# Patient Record
Sex: Male | Born: 1990 | Race: Black or African American | Hispanic: No | Marital: Married | Smoking: Current some day smoker
Health system: Southern US, Community
[De-identification: ages and names within clinical notes are randomized; demographics above are authoritative.]

## PROBLEM LIST (undated history)

## (undated) DIAGNOSIS — R569 Unspecified convulsions: Secondary | ICD-10-CM

## (undated) DIAGNOSIS — S0993XA Unspecified injury of face, initial encounter: Secondary | ICD-10-CM

## (undated) HISTORY — PX: NO PAST SURGERIES: SHX2092

---
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
## 2009-04-08 ENCOUNTER — Ambulatory Visit: Payer: Self-pay | Admitting: Infectious Diseases

## 2009-04-08 ENCOUNTER — Emergency Department (HOSPITAL_COMMUNITY): Admission: EM | Admit: 2009-04-08 | Discharge: 2009-04-08 | Payer: Self-pay | Admitting: Emergency Medicine

## 2009-04-08 ENCOUNTER — Observation Stay (HOSPITAL_COMMUNITY): Admission: EM | Admit: 2009-04-08 | Discharge: 2009-04-09 | Payer: Self-pay | Admitting: Emergency Medicine

## 2009-08-02 ENCOUNTER — Emergency Department (HOSPITAL_COMMUNITY): Admission: EM | Admit: 2009-08-02 | Discharge: 2009-08-02 | Payer: Self-pay | Admitting: Family Medicine

## 2009-09-04 ENCOUNTER — Ambulatory Visit: Payer: Self-pay | Admitting: Diagnostic Radiology

## 2009-09-04 ENCOUNTER — Emergency Department (HOSPITAL_BASED_OUTPATIENT_CLINIC_OR_DEPARTMENT_OTHER): Admission: EM | Admit: 2009-09-04 | Discharge: 2009-09-04 | Payer: Self-pay | Admitting: Emergency Medicine

## 2009-09-30 ENCOUNTER — Emergency Department (HOSPITAL_COMMUNITY): Admission: EM | Admit: 2009-09-30 | Discharge: 2009-09-30 | Payer: Self-pay | Admitting: Emergency Medicine

## 2009-10-03 ENCOUNTER — Emergency Department (HOSPITAL_COMMUNITY): Admission: EM | Admit: 2009-10-03 | Discharge: 2009-10-04 | Payer: Self-pay | Admitting: Emergency Medicine

## 2009-11-03 ENCOUNTER — Emergency Department (HOSPITAL_COMMUNITY): Admission: EM | Admit: 2009-11-03 | Discharge: 2009-11-03 | Payer: Self-pay | Admitting: Emergency Medicine

## 2010-05-05 ENCOUNTER — Emergency Department (HOSPITAL_COMMUNITY): Admission: EM | Admit: 2010-05-05 | Discharge: 2010-05-05 | Payer: Self-pay | Admitting: Emergency Medicine

## 2010-06-14 ENCOUNTER — Emergency Department (HOSPITAL_COMMUNITY)
Admission: EM | Admit: 2010-06-14 | Discharge: 2010-06-14 | Payer: Self-pay | Source: Home / Self Care | Admitting: Emergency Medicine

## 2010-11-03 ENCOUNTER — Emergency Department (HOSPITAL_COMMUNITY)
Admission: EM | Admit: 2010-11-03 | Discharge: 2010-11-03 | Payer: Self-pay | Source: Home / Self Care | Admitting: Emergency Medicine

## 2010-11-06 LAB — POCT I-STAT, CHEM 8
BUN: 10 mg/dL (ref 6–23)
Calcium, Ion: 1.13 mmol/L (ref 1.12–1.32)
Chloride: 102 mEq/L (ref 96–112)
Creatinine, Ser: 1.1 mg/dL (ref 0.4–1.5)
Glucose, Bld: 119 mg/dL — ABNORMAL HIGH (ref 70–99)
TCO2: 24 mmol/L (ref 0–100)

## 2010-11-27 ENCOUNTER — Emergency Department (HOSPITAL_COMMUNITY)
Admission: EM | Admit: 2010-11-27 | Discharge: 2010-11-27 | Disposition: A | Discharge status: Home / Self Care | Payer: Self-pay | Attending: Emergency Medicine | Admitting: Emergency Medicine

## 2010-11-27 ENCOUNTER — Emergency Department (HOSPITAL_COMMUNITY): Payer: Self-pay

## 2010-11-27 DIAGNOSIS — M25549 Pain in joints of unspecified hand: Secondary | ICD-10-CM | POA: Insufficient documentation

## 2010-11-27 DIAGNOSIS — M7989 Other specified soft tissue disorders: Secondary | ICD-10-CM | POA: Insufficient documentation

## 2010-11-27 DIAGNOSIS — G40909 Epilepsy, unspecified, not intractable, without status epilepticus: Secondary | ICD-10-CM | POA: Insufficient documentation

## 2010-11-27 DIAGNOSIS — M79609 Pain in unspecified limb: Secondary | ICD-10-CM | POA: Insufficient documentation

## 2010-12-30 LAB — BASIC METABOLIC PANEL
CO2: 16 mEq/L — ABNORMAL LOW (ref 19–32)
Calcium: 9.1 mg/dL (ref 8.4–10.5)
Chloride: 111 mEq/L (ref 96–112)
GFR calc Af Amer: >60 mL/min (ref >60)
Glucose, Bld: 118 mg/dL — ABNORMAL HIGH (ref 70–99)
Potassium: 5 mEq/L (ref 3.5–5.1)
Sodium: 142 mEq/L (ref 135–145)

## 2010-12-30 LAB — URINALYSIS, ROUTINE W REFLEX MICROSCOPIC
Glucose, UA: NEGATIVE mg/dL
Hgb urine dipstick: NEGATIVE
Ketones, ur: NEGATIVE mg/dL
Leukocytes, UA: NEGATIVE
pH: 6 (ref 5.0–8.0)

## 2010-12-30 LAB — CBC
HCT: 44.6 % (ref 39.0–52.0)
Hemoglobin: 14.7 g/dL (ref 13.0–17.0)
MCH: 27.2 pg (ref 26.0–34.0)
MCHC: 32.9 g/dL (ref 30.0–36.0)
MCV: 82.7 fL (ref 78.0–100.0)
RBC: 5.39 MIL/uL (ref 4.22–5.81)

## 2010-12-30 LAB — DIFFERENTIAL
Eosinophils Absolute: 0.2 10*3/uL (ref 0.0–0.7)
Lymphs Abs: 2.4 10*3/uL (ref 0.7–4.0)
Monocytes Absolute: 0.5 10*3/uL (ref 0.1–1.0)
Monocytes Relative: 9 % (ref 3–12)
Neutrophils Relative %: 40 % — ABNORMAL LOW (ref 43–77)

## 2010-12-30 LAB — URINE MICROSCOPIC-ADD ON

## 2010-12-30 LAB — RAPID URINE DRUG SCREEN, HOSP PERFORMED
Benzodiazepines: POSITIVE — AB
Cocaine: NOT DETECTED
Opiates: NOT DETECTED

## 2011-01-15 LAB — POCT I-STAT, CHEM 8
HCT: 44 % (ref 39.0–52.0)
Hemoglobin: 15 g/dL (ref 13.0–17.0)
Potassium: 3.9 mEq/L (ref 3.5–5.1)
Sodium: 137 mEq/L (ref 135–145)

## 2011-01-15 LAB — STREP A DNA PROBE: Group A Strep Probe: NEGATIVE

## 2011-01-17 LAB — CBC
Hemoglobin: 13.5 g/dL (ref 13.0–17.0)
RBC: 5.06 MIL/uL (ref 4.22–5.81)
WBC: 7.4 10*3/uL (ref 4.0–10.5)

## 2011-01-17 LAB — COMPREHENSIVE METABOLIC PANEL
ALT: 18 U/L (ref 0–53)
Alkaline Phosphatase: 84 U/L (ref 39–117)
CO2: 26 mEq/L (ref 19–32)
Chloride: 100 mEq/L (ref 96–112)
GFR calc non Af Amer: >60 mL/min (ref >60)
Glucose, Bld: 93 mg/dL (ref 70–99)
Potassium: 4.2 mEq/L (ref 3.5–5.1)
Sodium: 138 mEq/L (ref 135–145)
Total Protein: 8.6 g/dL — ABNORMAL HIGH (ref 6.0–8.3)

## 2011-01-17 LAB — DIFFERENTIAL
Basophils Relative: 1 % (ref 0–1)
Eosinophils Absolute: 0 10*3/uL (ref 0.0–0.7)
Monocytes Relative: 12 % (ref 3–12)
Neutrophils Relative %: 71 % (ref 43–77)

## 2011-01-17 LAB — MONONUCLEOSIS SCREEN: Mono Screen: NEGATIVE

## 2011-01-22 LAB — CBC
MCHC: 32.5 g/dL (ref 30.0–36.0)
MCV: 84 fL (ref 78.0–100.0)
Platelets: 221 10*3/uL (ref 150–400)
RBC: 4.72 MIL/uL (ref 4.22–5.81)
RBC: 5.49 MIL/uL (ref 4.22–5.81)
RDW: 16.4 % — ABNORMAL HIGH (ref 11.5–15.5)
WBC: 6.3 10*3/uL (ref 4.0–10.5)

## 2011-01-22 LAB — POCT I-STAT, CHEM 8
BUN: 15 mg/dL (ref 6–23)
Calcium, Ion: 1.27 mmol/L (ref 1.12–1.32)
Chloride: 107 mEq/L (ref 96–112)
Glucose, Bld: 110 mg/dL — ABNORMAL HIGH (ref 70–99)
HCT: 50 % (ref 39.0–52.0)

## 2011-01-22 LAB — RAPID URINE DRUG SCREEN, HOSP PERFORMED
Amphetamines: NOT DETECTED
Barbiturates: NOT DETECTED
Benzodiazepines: NOT DETECTED
Opiates: NOT DETECTED

## 2011-01-22 LAB — DIFFERENTIAL
Eosinophils Relative: 0 % (ref 0–5)
Lymphocytes Relative: 8 % — ABNORMAL LOW (ref 12–46)
Lymphs Abs: 0.5 10*3/uL — ABNORMAL LOW (ref 0.7–4.0)

## 2011-01-22 LAB — BASIC METABOLIC PANEL
Calcium: 8.8 mg/dL (ref 8.4–10.5)
Chloride: 107 mEq/L (ref 96–112)
Creatinine, Ser: 0.84 mg/dL (ref 0.4–1.5)
GFR calc Af Amer: >60 mL/min (ref >60)

## 2011-01-22 LAB — HIV ANTIBODY (ROUTINE TESTING W REFLEX): HIV: NONREACTIVE

## 2011-01-22 LAB — COMPREHENSIVE METABOLIC PANEL
AST: 40 U/L — ABNORMAL HIGH (ref 0–37)
CO2: 23 mEq/L (ref 19–32)
Calcium: 9.2 mg/dL (ref 8.4–10.5)
Creatinine, Ser: 0.85 mg/dL (ref 0.4–1.5)
GFR calc Af Amer: >60 mL/min (ref >60)
GFR calc non Af Amer: >60 mL/min (ref >60)
Total Protein: 7.8 g/dL (ref 6.0–8.3)

## 2011-01-22 LAB — TSH: TSH: 0.873 u[IU]/mL (ref 0.350–4.500)

## 2011-01-22 LAB — PHENYTOIN LEVEL, TOTAL: Phenytoin Lvl: <2.5 ug/mL — ABNORMAL LOW (ref 10.0–20.0)

## 2011-01-29 LAB — COMPREHENSIVE METABOLIC PANEL
Alkaline Phosphatase: 59 U/L (ref 39–117)
BUN: 10 mg/dL (ref 6–23)
Calcium: 9.7 mg/dL (ref 8.4–10.5)
Glucose, Bld: 114 mg/dL — ABNORMAL HIGH (ref 70–99)
Total Protein: 7.6 g/dL (ref 6.0–8.3)

## 2011-01-29 LAB — RAPID URINE DRUG SCREEN, HOSP PERFORMED
Amphetamines: NOT DETECTED
Barbiturates: NOT DETECTED
Benzodiazepines: NOT DETECTED
Opiates: NOT DETECTED

## 2011-02-27 NOTE — Discharge Summary (Signed)
NAMESEDRICK, TOBER NO.:  [id number]   MEDICAL RECORD NO.:  [id number]          PATIENT TYPE:  OBV   LOCATION:  3003                         FACILITY:  [facility]   PHYSICIAN:  Mick Sell, MD DATE OF BIRTH:  [birth year]   DATE OF ADMISSION:  04/08/2009  DATE OF DISCHARGE:  04/09/2009                               DISCHARGE SUMMARY   DISCHARGE DIAGNOSES:  1. Seizure - secondary to medical noncompliance with Dilantin.  2. Polysubstance abuse - alcohol occasionally and      tetrahydrocannabinol.  Urine drug screen pending on discharge.  3. History of seizure in January 2010 - two admissions through      emergency department for generalized seizure.  4. History of maxillofacial trauma in July 2009.   DISCHARGE MEDICATIONS:  Dilantin 100 mg tablet, takes 3 tablets at  nighttime.   DISPOSITION/FOLLOW UP:  Patient discharged from the hospital in stable  condition.  No postictal state.  Will follow up with Dr. Aldine Contes at [facility]  on July 12th, 2010 for further evaluation and management.  Follow up on  UDS results.  Pt will have to have EEG scheduled as an outpatient. Also  evaluate medical compliance with Dilantin. Please check Dilantin levels  on appointment.   CONSULTATIONS:  None.   PROCEDURES:  CT of the head without contrast - no acute intracranial  abnormalities, no hemorrhage, no mass lesions or acute infarctions.  Unremarkable bone and paranasal sinuses.   HISTORY OF PRESENT ILLNESS:  Pt is 20 year old male with nonsignificant  past medical history except 2 seizure episodes starting January 2010.  He presented to [facility] Emergency Department with main concern of  seizure episode.  It occurred on the morning of day of admission when  the patient was asleep.  Per ED report, the patient came into the  hospital, refused labs and as he was leaving he had another seizure;  reported not remembering any of the events before the seizure episode.  He was in his  usual state of health before the seizure.  He denied any  headaches.  No visual changes.  No auras.  No abdominal or urinary  concerns. Post seizure, he reported headaches occasionally,  nonconclusive and associated with double vision, vomiting, 5 episodes,  nonbloody, feeling tired.  Denied alcohol or drug use.   PHYSICAL EXAMINATION:  VITAL SIGNS:  T 97.1, BP 112/70, P 62, R 80, Sat  100% on RA.   GENERAL:  On physical exam, not in acute distress.  HEENT:  PERRLA, EOMI.  No conjunctival pallor.  No icterus.  Moist  mucous membranes.  No oropharyngeal erythema.  Bite marks on the side of  the tongue.  NECK:  Supple, no stiffness  LUNGS:  Good air movement.  No wheezing, crackles or rhonchi.  CARDIOVASCULAR:   Regular rate and rhythm. S1 and S2.  No JVD.  No  murmur.  ABDOMEN:  Soft, nontender, nondistended.  Bowel sounds present.  EXTREMITIES:  No edema.  No cyanosis.  SKIN:  No rashes or petechiae.  MUSCULOSKELETAL:   No joint effusions or tenderness.  Full range  of  motion in all four extremities.  NEUROLOGIC EXAM:   Alert, oriented x3.  CN II-XII intact.  Motor 5/5  bilaterally upper and lower extremities.  No asterixis.  Normal heel-to-  shin and finger-to-nose.   LABS:  Na 140, K 3.7, Cl 107, HCO3 19, BUN 15, Cr 1, glucose 110.  Bilirubin 0.5, alk phos 59, AST 40, ALT 18, protein 7.8, albumin 4.4,  calcium 9.2.  WBC 5.9, hemoglobin 15, platelets 221.   HOSPITAL COURSE:  1. Seizures:  This is the third admission for this patient secondary      to medical noncompliance with Dilantin.  On admission, Dilantin      level was 2.5.  Dilantin load was given.  The patient had one      seizure episode during the hospitalization, but has remained stable      and neurologically overall nonfocal exam.  Alcohol level is      undetectable.  HIV nonreactive.  TSH within normal limits.      Electrolytes within normal limits.  No hyperglycemia, no      hypernatremia, no hypocalcemia.  UDS  is pending on discharge.  The      patient will follow up with Dr. Colon Branch in [facility] for further      evaluation and management.  EEG to be done in outpatient setting.   Over 30 minutes was spent on discharge with the patient.      Mliss Sax, MD      Mick Sell, MD  Electronically Signed    IM/MEDQ  D:  04/09/2009  T:  04/09/2009  Job:  [id number]   cc:   Mick Sell, MD   Dr. Linton Rump at [facility]

## 2011-04-05 ENCOUNTER — Inpatient Hospital Stay (HOSPITAL_COMMUNITY)
Admission: EM | Admit: 2011-04-05 | Discharge: 2011-04-06 | DRG: 101 | Disposition: A | Discharge status: Home / Self Care | Payer: Self-pay | Attending: Internal Medicine | Admitting: Internal Medicine

## 2011-04-05 DIAGNOSIS — G40909 Epilepsy, unspecified, not intractable, without status epilepticus: Principal | ICD-10-CM | POA: Diagnosis present

## 2011-04-05 DIAGNOSIS — F1210 Cannabis abuse, uncomplicated: Secondary | ICD-10-CM | POA: Diagnosis present

## 2011-04-05 DIAGNOSIS — Z9119 Patient's noncompliance with other medical treatment and regimen: Secondary | ICD-10-CM

## 2011-04-05 DIAGNOSIS — F17200 Nicotine dependence, unspecified, uncomplicated: Secondary | ICD-10-CM | POA: Diagnosis present

## 2011-04-05 DIAGNOSIS — Z91199 Patient's noncompliance with other medical treatment and regimen due to unspecified reason: Secondary | ICD-10-CM

## 2011-04-05 LAB — DIFFERENTIAL
Basophils Absolute: 0 10*3/uL (ref 0.0–0.1)
Basophils Relative: 0 % (ref 0–1)
Eosinophils Absolute: 0 10*3/uL (ref 0.0–0.7)
Eosinophils Relative: 0 % (ref 0–5)
Lymphocytes Relative: 10 % — ABNORMAL LOW (ref 12–46)

## 2011-04-05 LAB — PHENYTOIN LEVEL, TOTAL: Phenytoin Lvl: <10 ug/mL — ABNORMAL LOW (ref 10.0–20.0)

## 2011-04-05 LAB — RAPID URINE DRUG SCREEN, HOSP PERFORMED
Amphetamines: NOT DETECTED
Barbiturates: NOT DETECTED
Benzodiazepines: NOT DETECTED
Tetrahydrocannabinol: POSITIVE — AB

## 2011-04-05 LAB — CBC
MCHC: 33 g/dL (ref 30.0–36.0)
Platelets: 277 10*3/uL (ref 150–400)
RDW: 13 % (ref 11.5–15.5)
WBC: 9.3 10*3/uL (ref 4.0–10.5)

## 2011-04-05 LAB — MAGNESIUM: Magnesium: 2 mg/dL (ref 1.5–2.5)

## 2011-04-05 LAB — POCT I-STAT, CHEM 8
Calcium, Ion: 1.18 mmol/L (ref 1.12–1.32)
Chloride: 107 mEq/L (ref 96–112)
Glucose, Bld: 100 mg/dL — ABNORMAL HIGH (ref 70–99)
HCT: 44 % (ref 39.0–52.0)

## 2011-04-05 LAB — URINALYSIS, ROUTINE W REFLEX MICROSCOPIC
Bilirubin Urine: NEGATIVE
Hgb urine dipstick: NEGATIVE
Ketones, ur: NEGATIVE mg/dL
Nitrite: NEGATIVE
pH: 5.5 (ref 5.0–8.0)

## 2011-04-05 LAB — CK TOTAL AND CKMB (NOT AT ARMC)
CK, MB: 4.3 ng/mL — ABNORMAL HIGH (ref 0.3–4.0)
Total CK: 726 U/L — ABNORMAL HIGH (ref 7–232)

## 2011-04-10 NOTE — H&P (Signed)
Kyle Adams, Kyle Adams NO.:  [id number]  MEDICAL RECORD NO.:  [id number]  LOCATION:  WLED                         FACILITY:  [facility]  PHYSICIAN:  Conley Canal, MD      DATE OF BIRTH:  1991-03-22  DATE OF ADMISSION:  04/05/2011 DATE OF DISCHARGE:                             HISTORY & PHYSICAL   The patient has no primary care provider.  CHIEF COMPLAINT:  Seizures.  HISTORY OF PRESENT ILLNESS:  Kyle Adams is a 20 year old male with history of epilepsy who has been on Dilantin for the last 5 years or so. The patient comes in because he had a seizure witnessed by his significant other around 1 a.m.  According to the significant other, this lasted about 3 minutes.  It was associated with incontinence and was generalized.  She has experienced this for the last 5 years that she has been with the patient.  She says that he ran out of his Dilantin and had been trying to stretch it to take it for as long as possible as he ran out of refills and he does not have a primary care provider to get prescriptions.  Since 1 a.m. the patient has had 3 seizures.  When he presented to the emergency room, his Dilantin level was less than 10 and urine drug screen was positive for marijuana.  Urinalysis was normal. He has received 2 mg of Ativan as well as 1 g of Dilantin IV.  At the time of my evaluation, the patient is sedated most likely because of the Ativan, but he is arousable.  He is not able to give me any history, so all the history was obtained from previous medical records as well as his significant other who is very involved in his care.  According to her, the patient has had no fevers, no headaches.  PAST MEDICAL HISTORY: 1. Seizure disorder. 2. History of head trauma in 2009. 3. History of marijuana use. 4. Tobacco abuse.  SOCIAL HISTORY:  The patient lives with his significant other.  He has 2 kids.  Smokes cigarettes, marijuana.  No alcohol.  ALLERGIES:  No  known drug allergies.  HOME MEDICATIONS:  Dilantin 300 mg q.h.s.  FAMILY HISTORY:  His significant other denies family history of epilepsy.  No other history could be obtained.  REVIEW OF SYSTEMS:  Unremarkable except as highlighted in the history of present illness.  PHYSICAL EXAMINATION:  GENERAL:  This is a young man who is somnolent. He is not in acute distress. VITAL SIGNS:  Blood pressure 130 systolic, heart rate 70s, he is afebrile, oxygenating adequately, respiratory rate around 16. HEAD, EARS, NOSE AND THROAT:  Pupils equal, reacting to light.  No jugular venous distention. NECK:  No carotid bruits.  No neck stiffness. RESPIRATORY SYSTEM:  Good air entry bilaterally with no rhonchi, rales, or wheezes. CARDIOVASCULAR SYSTEM:  First, second heart sounds heard.  No murmurs. Pulse regular. ABDOMEN:  Scaphoid, soft, nontender.  No palpable organomegaly.  Bowel sounds are normal. CNS:  Patient is mostly somnolent, but follows commands.  He is moving all extremities.  No focalizing deficits. EXTREMITIES:  No pedal edema.  Peripheral pulses equal.  LABORATORY  DATA:  Reviewed.  Significant for Dilantin level less than 10.  Urine drug screen positive for tetrahydrocannabinol.  Urinalysis negative.  Electrolytes unremarkable.  No imaging studies.  IMPRESSION:  A 20 year old male with known history of epilepsy who comes in with breakthrough seizures most likely due to nonadherence with medications as his Dilantin level is subtherapeutic.  There is no evidence of localizing deficits on exam to worry about an acute intracranial event.  He has been loaded with Dilantin.  PLAN: 1. The patient will be admitted to telemetry with seizure precautions.     We will continue Dilantin and give Ativan as needed.  If any     significant neurological problems, we will obtain CT of the brain     with contrast to further evaluate, otherwise we will just observe     for the patient to be fully  awake.  He will require clinical social     work assistance to ensure proper followup and medication     assistance. 2. DVT, GI prophylaxis. 3. The patient's condition closely guarded.     Conley Canal, MD     SR/MEDQ  D:  04/05/2011  T:  04/05/2011  Job:  [id number]  Electronically Signed by Conley Canal  on 04/10/2011 10:21:21 AM

## 2011-04-10 NOTE — Discharge Summary (Signed)
NAMEPELHAM, HENNICK NO.:  [id number]  MEDICAL RECORD NO.:  [id number]  LOCATION:  1405                         FACILITY:  [facility]  PHYSICIAN:  Conley Canal, MD      DATE OF BIRTH:  [birth year]  DATE OF ADMISSION:  04/05/2011 DATE OF DISCHARGE:  04/06/2011                        DISCHARGE SUMMARY - REFERRING   PRIMARY CARE PROVIDER:  [facility].  DISCHARGE DIAGNOSES: 1. Breakthrough seizure secondary to noncompliance with Dilantin. 2. History of polysubstance abuse.  DISCHARGE MEDICATIONS:  Dilantin 300 mg p.o. daily at bedtime.  DIAGNOSTIC LABS:  Sodium 142, potassium 3.9, chloride 107, CO2 23, BUN 12, creatinine 1.00, WBC 9.3, hemoglobin 12.9, hematocrit 39.1, platelets 277,000, neutrophils 87%, absolute neutrophils 8.0, magnesium 2.0.  Total CK 726, CK-MB 4.3, troponin I less than 0.30.  Dilantin level 5.9.  DIAGNOSTIC IMAGING:  None.  CONSULTATIONS:  None.  PROCEDURES:  None.  BRIEF HISTORY:  Mr. Oien is a 20 year old male with a history of epilepsy.  He has been on Dilantin for the last 5 years or so.  The patient came to the [facility] Emergency Room because he had a seizure witnessed by his significant other around 1 a.m. on June 20.  According to the significant other, it lasted about 3 minutes.  It was associated with incontinence and was generalized.  She indicated that the patient had run out of Dilantin and had been trying to stretch it to take it for as long as possible as he ran out of refills and does not have a primary care provider to get prescriptions.  The patient had three seizures after that.  When he presented to the emergency room, his Dilantin level was less than 10 and a urine drug screen was positive for marijuana. The patient was admitted under close observation and was given a loading dose of Dilantin.  [facility] were called to admit.  HOSPITAL COURSE BY PROBLEM: 1. Breakthrough seizure secondary to  nonadherence to Dilantin.  The     patient received 2 mg of Ativan in the emergency room as well as 1     g of Dilantin IV.  He was admitted to telemetry floor on seizure     precautions.  He was monitored closely.  Through the night, there     was no further seizure activity.  Dilantin level this morning is     13.3. 2. History of polysubstance abuse.  Urine drug screen on admission was     positive for marijuana.  The patient was educated and/or counseled     regarding polysubstance abuse and the risks given his seizure     disorder.  PHYSICAL EXAMINATION:  VITAL SIGNS:  Temperature 97.8, blood pressure 104/68, heart rate 96, respirations 18, sats 96% on room air. GENERAL:  Lying in bed with his girlfriend, no acute distress. CV:  Regular rate and rhythm.  No murmur, gallop or rub.  No lower extremity edema. RESPIRATORY:  Nonlabored breath sounds.  Clear to auscultation bilaterally.  No rhonchi, wheezes or rales. ABDOMEN:  Flat, soft, positive bowel sounds throughout, nontender to palpation. NEUROLOGIC:  Alert and oriented x3.  Speech clear.  Gait steady.  FOLLOWUP:  The patient  will follow up with [facility] in 1 to 2 weeks. The patient will be provided with prescription for his Dilantin as well as instructions regarding compliance.  DISPOSITION:  The patient is medically stable and ready to be discharged to home.  Time spent on this discharge 35 minutes.     Gwenyth Bender, NP   ______________________________ Conley Canal, MD    KMB/MEDQ  D:  04/06/2011  T:  04/06/2011  Job:  [id number]  Electronically Signed by Conley Canal  on 04/10/2011 10:21:23 AM

## 2011-06-07 ENCOUNTER — Observation Stay (HOSPITAL_COMMUNITY)
Admission: EM | Admit: 2011-06-07 | Discharge: 2011-06-07 | Discharge status: Against Medical Advice | Payer: Self-pay | Attending: Internal Medicine | Admitting: Internal Medicine

## 2011-06-07 DIAGNOSIS — Z91199 Patient's noncompliance with other medical treatment and regimen due to unspecified reason: Secondary | ICD-10-CM | POA: Insufficient documentation

## 2011-06-07 DIAGNOSIS — R1110 Vomiting, unspecified: Secondary | ICD-10-CM | POA: Insufficient documentation

## 2011-06-07 DIAGNOSIS — Z79899 Other long term (current) drug therapy: Secondary | ICD-10-CM | POA: Insufficient documentation

## 2011-06-07 DIAGNOSIS — F17200 Nicotine dependence, unspecified, uncomplicated: Secondary | ICD-10-CM | POA: Insufficient documentation

## 2011-06-07 DIAGNOSIS — Z9119 Patient's noncompliance with other medical treatment and regimen: Secondary | ICD-10-CM | POA: Insufficient documentation

## 2011-06-07 DIAGNOSIS — G40309 Generalized idiopathic epilepsy and epileptic syndromes, not intractable, without status epilepticus: Principal | ICD-10-CM | POA: Insufficient documentation

## 2011-06-07 LAB — BASIC METABOLIC PANEL
BUN: 15 mg/dL (ref 6–23)
Chloride: 103 mEq/L (ref 96–112)
GFR calc Af Amer: >60 mL/min (ref >60)
Glucose, Bld: 96 mg/dL (ref 70–99)
Potassium: 3.6 mEq/L (ref 3.5–5.1)

## 2011-07-19 ENCOUNTER — Emergency Department (HOSPITAL_COMMUNITY)
Admission: EM | Admit: 2011-07-19 | Discharge: 2011-07-19 | Disposition: A | Discharge status: Home / Self Care | Payer: Medicaid Other | Attending: Emergency Medicine | Admitting: Emergency Medicine

## 2011-07-19 DIAGNOSIS — Z79899 Other long term (current) drug therapy: Secondary | ICD-10-CM | POA: Insufficient documentation

## 2011-07-19 DIAGNOSIS — G40909 Epilepsy, unspecified, not intractable, without status epilepticus: Secondary | ICD-10-CM | POA: Insufficient documentation

## 2011-07-19 LAB — BASIC METABOLIC PANEL
GFR calc non Af Amer: >90 mL/min (ref >90)
Glucose, Bld: 109 mg/dL — ABNORMAL HIGH (ref 70–99)
Potassium: 4.1 mEq/L (ref 3.5–5.1)
Sodium: 137 mEq/L (ref 135–145)

## 2011-07-19 LAB — BASIC METABOLIC PANEL WITH GFR
BUN: 13 mg/dL (ref 6–23)
CO2: 25 meq/L (ref 19–32)
Calcium: 9.3 mg/dL (ref 8.4–10.5)
Chloride: 104 meq/L (ref 96–112)
Creatinine, Ser: 0.82 mg/dL (ref 0.50–1.35)
GFR calc Af Amer: >90 mL/min (ref >90)

## 2011-07-19 LAB — PHENYTOIN LEVEL, TOTAL: Phenytoin Lvl: <2.5 ug/mL — ABNORMAL LOW (ref 10.0–20.0)

## 2011-07-25 NOTE — Discharge Summary (Signed)
  NAMELANE, ELAND NO.:  [id number]  MEDICAL RECORD NO.:  [id number]  LOCATION:  1441                         FACILITY:  [facility]  PHYSICIAN:  Baltazar Najjar, MD     DATE OF BIRTH:  1991/01/04  DATE OF ADMISSION:  06/07/2011 DATE OF DISCHARGE:  06/07/2011                              DISCHARGE SUMMARY   ADDENDUM: This is a note to document the patient leaving the hospital against medical advice.  Please refer to H and P that dictated by the undersigned physician today for more details.  The patient was postictal when I admitted him this morning.  He was very lethargic, so I kept him n.p.o., and requested a bedside swallow evaluation.  Apparently, that was not done until the patient went to the floor later on the day and the patient was very frustrated that he would like to be fed immediately.  I reviewed the course from RN regarding this issue and I requested the RN bedside swallow evaluation and then initiation of diet if he passes, however, I was informed by the nurse, it cannot be done as per protocol because it can be done only for patients admitted for TIA or stroke .  I was coming to reevaluate the  patient and start him on a diet  after I finish seeing a patient in the ED , however, the patient left the hospital  against medical advice prior to my arrival.  As per nurse, his girlfriend who was in the room pulled his IV and they left the hospital against medical advise.          ______________________________ Baltazar Najjar, MD     SA/MEDQ  D:  06/07/2011  T:  06/07/2011  Job:  [id number]  cc:   Clinic [facility] Fax: [phone]  Electronically Signed by Hannah Beat MD on 07/25/2011 07:11:24 PM

## 2011-07-25 NOTE — H&P (Signed)
NAMELANDRUM, CARBONELL NO.:  [id number]  MEDICAL RECORD NO.:  [id number]  LOCATION:  WLED                         FACILITY:  [facility]  PHYSICIAN:  Baltazar Najjar, MD     DATE OF BIRTH:  12-07-1990  DATE OF ADMISSION:  06/07/2011 DATE OF DISCHARGE:                             HISTORY & PHYSICAL   PCP:  [facility].  CHIEF COMPLAINT:  Seizures.  HISTORY OF PRESENT ILLNESS:  Mr. Kyle Adams is a 20 year old African American man with history of seizure disorders and who is apparently noncompliance with his Dilantin.  He was discharged last from the hospital in June 2012 and was instructed to take Dilantin 300 mg q.h.s.; however, the patient was taking it every other day.  The patient is currently postictal.  He is lethargic and history is provided by his girlfriend at bedside and from the emergency room records.  Girlfriend stated that the patient had 2 episodes of seizures at home, each lasted about 3-4 minutes.  She described it as tonic-clonic generalized seizures.  Denied any association of bowel or urinary incontinence, no tongue biting.  In the ED, the patient had another episode of seizure, which as per ER records lasted about a minute and associated with tongue biting this time with no incontinence.  The patient's Dilantin level was found to be subtherapeutic less than 2.5.  He was loaded with 1200 mg of Dilantin and we were called and asked to admit the patient for observation and further management.  PAST MEDICAL HISTORY: 1. History of seizure disorder. 2. Prior history of polysubstance abuse. 3. Tobacco abuse. 4. History of head trauma in 2009. 5. Medical noncompliance.  ALLERGIES:  No known drug allergies.  HOME MEDICATION:  He was prescribed Dilantin 300 mg q.h.s., but was taking it every other day.  SOCIAL HISTORY:  Lives with his girlfriend, smoke cigarettes.  As per girlfriend, no illicit drugs or EtOH and she stated that he is  currently unemployed and however studying to take his GED.  REVIEW OF SYSTEMS:  Unobtainable.  The patient is postictal.  However as per girlfriend, there are no other symptoms reported to her by him.  He was eating and drinking well.  There was no fever or chills.  No cough or shortness of breath.  No abdominal pain or change in bowel habits.  PHYSICAL EXAMINATION:  VITAL SIGNS:  Blood pressure 110/66, heart rate of 85, temperature 98.5, O2 sat 96% on room air. GENERAL:  He is lethargic, however, arousable, not in acute distress. NECK:  Supple. LUNGS:  Clear to auscultation bilaterally.  No rales or wheezing appreciated.  His respiratory rate is about 18. CARDIOVASCULAR SYSTEM:  S1 and S2, regular rhythm and rate.  No murmurs. ABDOMEN:  Soft, nontender.  Bowel sounds normal. EXTREMITIES:  No pedal edema. NEUROLOGICAL:  The patient is lethargic, however, arousable and he moves all extremities.  There is no focal deficit appreciated on limited exam.  LAB RESULTS:  Dilantin level less than 2.5, potassium 3.6, BUN 15, creatinine 1.02.  ASSESSMENT/PLAN: 1. Seizure secondary to subtherapeutic Dilantin level. 2. Subtherapeutic Dilantin level secondary to noncompliance with meds. 3. Tobacco abuse.  PLAN: 1. The patient will  be observed on telemetry.  We will apply seizures     precautions.  The patient was already loaded with Dilantin in the     ED. 2. We will request bedside swallow evaluation prior to initiating diet     and p.o. meds. 3. Ativan p.r.n. for any breakthrough seizures. 4. The patient will need counseling on compliance with his medication,     he was supposed to follow with [facility], but apparently he is     not following with them and he gets his medications through ED     visits. 5. Social worker consult for counseling on medication adherence and on     tobacco cessation as well and for referral to [facility].          ______________________________ Baltazar Najjar, MD     SA/MEDQ  D:  06/07/2011  T:  06/07/2011  Job:  [id number]  Electronically Signed by Hannah Beat MD on 07/25/2011 07:04:20 PM

## 2011-08-26 ENCOUNTER — Encounter: Payer: Self-pay | Admitting: Emergency Medicine

## 2011-08-26 ENCOUNTER — Emergency Department (HOSPITAL_COMMUNITY)
Admission: EM | Admit: 2011-08-26 | Discharge: 2011-08-26 | Disposition: A | Discharge status: Home / Self Care | Payer: Self-pay | Attending: Emergency Medicine | Admitting: Emergency Medicine

## 2011-08-26 DIAGNOSIS — R61 Generalized hyperhidrosis: Secondary | ICD-10-CM | POA: Insufficient documentation

## 2011-08-26 DIAGNOSIS — Z79899 Other long term (current) drug therapy: Secondary | ICD-10-CM | POA: Insufficient documentation

## 2011-08-26 DIAGNOSIS — R000 Tachycardia, unspecified: Secondary | ICD-10-CM | POA: Insufficient documentation

## 2011-08-26 DIAGNOSIS — R404 Transient alteration of awareness: Secondary | ICD-10-CM | POA: Insufficient documentation

## 2011-08-26 DIAGNOSIS — F17200 Nicotine dependence, unspecified, uncomplicated: Secondary | ICD-10-CM | POA: Insufficient documentation

## 2011-08-26 DIAGNOSIS — R569 Unspecified convulsions: Secondary | ICD-10-CM | POA: Insufficient documentation

## 2011-08-26 DIAGNOSIS — R4182 Altered mental status, unspecified: Secondary | ICD-10-CM | POA: Insufficient documentation

## 2011-08-26 HISTORY — DX: Unspecified convulsions: R56.9

## 2011-08-26 LAB — BASIC METABOLIC PANEL
BUN: 14 mg/dL (ref 6–23)
Chloride: 103 mEq/L (ref 96–112)
GFR calc Af Amer: >90 mL/min (ref >90)
Potassium: 3.9 mEq/L (ref 3.5–5.1)
Sodium: 138 mEq/L (ref 135–145)

## 2011-08-26 MED ORDER — SODIUM CHLORIDE 0.9 % IV SOLN
1000.0000 mg | Freq: Once | INTRAVENOUS | Status: AC
  Administered 2011-08-26: 1000 mg via INTRAVENOUS
  Filled 2011-08-26 (×2): qty 20

## 2011-08-26 MED ORDER — LORAZEPAM 2 MG/ML IJ SOLN
INTRAMUSCULAR | Status: AC
  Administered 2011-08-26: 2 mg via INTRAMUSCULAR
  Filled 2011-08-26: qty 1

## 2011-08-26 MED ORDER — LORAZEPAM 2 MG/ML IJ SOLN: 2.0000 mg | Freq: Once | INTRAMUSCULAR | Status: DC

## 2011-08-26 MED ORDER — LORAZEPAM 2 MG/ML IJ SOLN
INTRAMUSCULAR | Status: AC
  Filled 2011-08-26: qty 1

## 2011-08-26 MED ORDER — LORAZEPAM 2 MG/ML IJ SOLN
2.0000 mg | Freq: Once | INTRAMUSCULAR | Status: AC
  Administered 2011-08-26: 2 mg via INTRAMUSCULAR

## 2011-08-26 NOTE — ED Notes (Signed)
Pt had a seizure lasted near 45 seconds. IM ativan given as ordered, Md at bedside.

## 2011-08-26 NOTE — ED Notes (Signed)
Pt asked for blanket, provided, no other c/o.  Pulled off cardiac monitor and oxygen. States cannot rest with "this stuff on".

## 2011-08-26 NOTE — ED Provider Notes (Signed)
History     CSN: [id number] Arrival date & time: 08/26/2011  6:56 AM   First MD Initiated Contact with Patient 08/26/11 0703      Chief Complaint  Patient presents with  . Seizures    family reported to EMS that patient had seizure. EMS reported that patient was postictal upon arrival. patient was uncooperative with EMS crew    (Consider location/radiation/quality/duration/timing/severity/associated sxs/prior treatment) HPI This 20 year old male with a history of seizures now presents via EMS after a witnessed seizure. Per report the patient was found seizing by family, who called EMS. On arrival the patient was noted to be interactive, though inconsistently cooperative. On ED arrival, the patient denies complaints, though he is still somnolent. Past Medical History  Diagnosis Date  . Seizures     History reviewed. No pertinent past surgical history.  History reviewed. No pertinent family history.  History  Substance Use Topics  . Smoking status: Current Everyday Smoker  . Smokeless tobacco: Not on file  . Alcohol Use: Yes     occasionally      Review of Systems  Unable to perform ROS: Mental status change    Allergies  Review of patient's allergies indicates no known allergies.  Home Medications   Current Outpatient Rx  Name Route Sig Dispense Refill  . PHENYTOIN SODIUM EXTENDED 300 MG PO CAPS Oral Take 300 mg by mouth daily.        BP 115/59  Pulse 97  Temp(Src) 98 F (36.7 C) (Oral)  Resp 20  SpO2 92%  Physical Exam  Constitutional: He appears well-developed and well-nourished. He appears distressed.  HENT:  Head: Normocephalic and atraumatic.  Eyes: Conjunctivae are normal.  Neck: No JVD present. No thyromegaly present.  Cardiovascular: Tachycardia present.   Pulmonary/Chest: Effort normal and breath sounds normal. No stridor.  Abdominal: Soft. He exhibits no distension.  Musculoskeletal: He exhibits no edema.  Lymphadenopathy:    He has no  cervical adenopathy.  Neurological:       This is postictal on initial presentation. He wakens, gives minimally coherent responses. No focal neuro deficit, though the exam is incomplete.  Skin: Skin is warm. He is diaphoretic. No erythema.  Psychiatric:       Unable to assess    ED Course  Procedures (including critical care time)   Labs Reviewed  PHENYTOIN LEVEL, TOTAL  BASIC METABOLIC PANEL  MAGNESIUM   No results found.   No diagnosis found.    MDM  This 20 year old male presents following a seizure. Notably the patient has a history of seizures, and multiple visits with similar presentation, the most recent of which have all demonstrated subtherapeutic Dilantin levels. Soon after the patient's initial arrival he had a seizure. He received from muscular Ativan, and calm. Initial labs demonstrated subtherapeutic Dilantin, no other notable abnormalities. During the patient's episodes of near consciousness, he was not agreeable, fighting attempts for IV access. Given the patient's history of Dilantin noncompliance, and his frequent presentations for seizure, there is some concern for the patient's capacity to take care of himself. The patient's sister will be advised of these concerns on her arrival.  12:55 PM The patient had an additional seizure episode. Following provision of IV Ativan. The patient received an IV loading dose of Dilantin. Although the patient has a history of. Poor compliance with medications, he'll be discharged with instructions to take his Dilantin as directed.          Gerhard Munch, MD 08/26/11 1256

## 2011-08-26 NOTE — ED Notes (Signed)
Pt post ictal, resting quietly, iv placed with Md assist.

## 2011-08-26 NOTE — ED Notes (Signed)
Patient had seizure at home, ambulance found him postictal. Patient was uncooperative with the crew. Patient is drowsy at this time. Respond to questions appropriately. Stated he does not remember having seizure. He stated he has been congested.

## 2011-08-26 NOTE — ED Notes (Signed)
Pt post ictal, o2 via nrb placed and pt keeping on at present.

## 2011-08-26 NOTE — ED Notes (Signed)
PADS PLACED AROUND BED BY EMT R Natsumi Whitsitt

## 2011-08-26 NOTE — ED Notes (Signed)
Family was in waiting room instructed to come back to pod A, have not arrived. Pt states does not know phone number to call.

## 2011-08-26 NOTE — ED Notes (Signed)
Pt awake, was incontinent of urine, given disposable scrubs and assisted, shorts and underwear in belongings bag with pt. Pt aware family called and to come get him. Denies c/o at present.

## 2011-08-26 NOTE — ED Notes (Signed)
Family called and aware he is waiting for a ride home.

## 2011-08-26 NOTE — ED Notes (Signed)
Pt had seizure, Md aware, given ativan as ordered.

## 2011-08-26 NOTE — ED Notes (Signed)
Pt denies c/o. Sleeping off and on. No distress. vss.

## 2011-08-26 NOTE — ED Notes (Signed)
Pt resting quietly at present but is arousable. No distress. Aware had another seizure.

## 2011-08-26 NOTE — ED Notes (Signed)
Assumed care, no distress, Md aware pt refused iv, pt also refused lab work and Md aware. Pt states does not like needles and does not need labwork. Informed him that is will let us know if his dilantin level is low but state "I know" but still refuses. Pt also refuses oxygen.

## 2011-10-12 ENCOUNTER — Emergency Department (HOSPITAL_COMMUNITY): Payer: Medicaid Other

## 2011-10-12 ENCOUNTER — Inpatient Hospital Stay (HOSPITAL_COMMUNITY)
Admission: EM | Admit: 2011-10-12 | Discharge: 2011-10-13 | DRG: 101 | Disposition: A | Discharge status: Home / Self Care | Payer: Medicaid Other | Attending: Internal Medicine | Admitting: Internal Medicine

## 2011-10-12 ENCOUNTER — Encounter (HOSPITAL_COMMUNITY): Payer: Self-pay | Admitting: Emergency Medicine

## 2011-10-12 DIAGNOSIS — G40901 Epilepsy, unspecified, not intractable, with status epilepticus: Secondary | ICD-10-CM

## 2011-10-12 DIAGNOSIS — Z91199 Patient's noncompliance with other medical treatment and regimen due to unspecified reason: Secondary | ICD-10-CM

## 2011-10-12 DIAGNOSIS — R569 Unspecified convulsions: Secondary | ICD-10-CM | POA: Diagnosis present

## 2011-10-12 DIAGNOSIS — G40909 Epilepsy, unspecified, not intractable, without status epilepticus: Principal | ICD-10-CM | POA: Diagnosis present

## 2011-10-12 DIAGNOSIS — Z9119 Patient's noncompliance with other medical treatment and regimen: Secondary | ICD-10-CM

## 2011-10-12 DIAGNOSIS — D72829 Elevated white blood cell count, unspecified: Secondary | ICD-10-CM | POA: Diagnosis present

## 2011-10-12 DIAGNOSIS — F17200 Nicotine dependence, unspecified, uncomplicated: Secondary | ICD-10-CM | POA: Diagnosis present

## 2011-10-12 HISTORY — DX: Unspecified injury of face, initial encounter: S09.93XA

## 2011-10-12 LAB — BASIC METABOLIC PANEL
CO2: 18 mEq/L — ABNORMAL LOW (ref 19–32)
Calcium: 9.8 mg/dL (ref 8.4–10.5)
Creatinine, Ser: 1.09 mg/dL (ref 0.50–1.35)
Glucose, Bld: 137 mg/dL — ABNORMAL HIGH (ref 70–99)

## 2011-10-12 LAB — DIFFERENTIAL
Basophils Absolute: 0 10*3/uL (ref 0.0–0.1)
Eosinophils Relative: 0 % (ref 0–5)
Lymphocytes Relative: 13 % (ref 12–46)
Neutro Abs: 14.2 10*3/uL — ABNORMAL HIGH (ref 1.7–7.7)
Neutrophils Relative %: 81 % — ABNORMAL HIGH (ref 43–77)

## 2011-10-12 LAB — CBC
Platelets: 263 10*3/uL (ref 150–400)
RDW: 14.4 % (ref 11.5–15.5)
WBC: 17.6 10*3/uL — ABNORMAL HIGH (ref 4.0–10.5)

## 2011-10-12 LAB — RAPID URINE DRUG SCREEN, HOSP PERFORMED
Amphetamines: NOT DETECTED
Barbiturates: NOT DETECTED
Tetrahydrocannabinol: POSITIVE — AB

## 2011-10-12 LAB — PHENYTOIN LEVEL, TOTAL
Phenytoin Lvl: <2.5 ug/mL — ABNORMAL LOW (ref 10.0–20.0)
Phenytoin Lvl: 14 ug/mL (ref 10.0–20.0)

## 2011-10-12 MED ORDER — DOCUSATE SODIUM 100 MG PO CAPS
100.0000 mg | ORAL_CAPSULE | Freq: Two times a day (BID) | ORAL | Status: DC
  Filled 2011-10-12 (×5): qty 1

## 2011-10-12 MED ORDER — LORAZEPAM 2 MG/ML IJ SOLN
INTRAMUSCULAR | Status: AC
  Administered 2011-10-12: 2 mg via INTRAVENOUS
  Filled 2011-10-12: qty 1

## 2011-10-12 MED ORDER — SODIUM CHLORIDE 0.9 % IV SOLN: 350.0000 mg | INTRAVENOUS | Status: DC

## 2011-10-12 MED ORDER — SODIUM CHLORIDE 0.9 % IV SOLN
INTRAVENOUS | Status: AC
  Administered 2011-10-12: 12:00:00 via INTRAVENOUS

## 2011-10-12 MED ORDER — SODIUM CHLORIDE 0.9 % IJ SOLN: 3.0000 mL | Freq: Two times a day (BID) | INTRAMUSCULAR | Status: DC

## 2011-10-12 MED ORDER — SODIUM CHLORIDE 0.9 % IV SOLN
350.0000 mg | INTRAVENOUS | Status: AC
  Administered 2011-10-12: 350 mg via INTRAVENOUS
  Filled 2011-10-12: qty 7

## 2011-10-12 MED ORDER — HEPARIN SODIUM (PORCINE) 5000 UNIT/ML IJ SOLN
5000.0000 [IU] | Freq: Three times a day (TID) | INTRAMUSCULAR | Status: DC
  Administered 2011-10-12 – 2011-10-13 (×4): 5000 [IU] via SUBCUTANEOUS
  Filled 2011-10-12 (×9): qty 1

## 2011-10-12 MED ORDER — ACETAMINOPHEN 325 MG PO TABS: 650.0000 mg | ORAL_TABLET | Freq: Four times a day (QID) | ORAL | Status: DC | PRN

## 2011-10-12 MED ORDER — SODIUM CHLORIDE 0.9 % IJ SOLN: 3.0000 mL | INTRAMUSCULAR | Status: DC | PRN

## 2011-10-12 MED ORDER — ACETAMINOPHEN 650 MG RE SUPP: 650.0000 mg | Freq: Four times a day (QID) | RECTAL | Status: DC | PRN

## 2011-10-12 MED ORDER — ONDANSETRON HCL 4 MG/2ML IJ SOLN
4.0000 mg | Freq: Four times a day (QID) | INTRAMUSCULAR | Status: DC | PRN
Start: 2011-10-12 — End: 2011-10-13
  Filled 2011-10-12: qty 2

## 2011-10-12 MED ORDER — SODIUM CHLORIDE 0.9 % IV SOLN
1000.0000 mg | INTRAVENOUS | Status: AC
  Administered 2011-10-12: 1000 mg via INTRAVENOUS
  Filled 2011-10-12: qty 20

## 2011-10-12 MED ORDER — SODIUM CHLORIDE 0.9 % IV SOLN
250.0000 mL | INTRAVENOUS | Status: DC | PRN
  Administered 2011-10-13: 250 mL via INTRAVENOUS

## 2011-10-12 MED ORDER — SODIUM CHLORIDE 0.9 % IV SOLN: 1000.0000 mg | Freq: Once | INTRAVENOUS | Status: DC

## 2011-10-12 MED ORDER — SODIUM CHLORIDE 0.9 % IV SOLN: INTRAVENOUS | Status: DC

## 2011-10-12 MED ORDER — PHENYTOIN SODIUM 50 MG/ML IJ SOLN
100.0000 mg | Freq: Three times a day (TID) | INTRAMUSCULAR | Status: DC
  Administered 2011-10-12 – 2011-10-13 (×2): 100 mg via INTRAVENOUS
  Filled 2011-10-12 (×5): qty 2

## 2011-10-12 NOTE — Progress Notes (Signed)
MEDICATION RELATED CONSULT NOTE - INITIAL   Pharmacy Consult for Phenytoin Indication: seizures  No Known Allergies  Patient Measurements:   90kg  Vital Signs: Temp: 98.3 F (36.8 C) (12/28 1525) Temp src: Oral (12/28 1525) BP: 129/75 mmHg (12/28 1525) Pulse Rate: 109  (12/28 1525) Intake/Output from previous day:   Intake/Output from this shift: Total I/O In: 2520 [I.V.:1520; Other:1000] Out: 500 [Urine:500]  Labs:  Basename 10/12/11 1015 10/12/11 0930  WBC 17.6* --  HGB 16.0 --  HCT 47.2 --  PLT 263 --  APTT -- --  CREATININE -- 1.09  LABCREA -- --  CREATININE -- 1.09  CREAT24HRUR -- --  MG -- --  PHOS -- --  ALBUMIN -- --  PROT -- --  ALBUMIN -- --  AST -- --  ALT -- --  ALKPHOS -- --  BILITOT -- --  BILIDIR -- --  IBILI -- --   CrCl is unknown because there is no height on file for the current visit.  Phenytoin level <2.5   Medical History: Past Medical History  Diagnosis Date  . Seizures   . Asthma   . Facial trauma 04/2008    Medications:  Reportedly on phenytoin PTA 300mg  nightly, has missed doses recently due to financial constraints.  Assessment: 20 yo M w/ acute sz. Pharmacy to dose phenytoin. PHT level on admission undetectable (noncompliance due to financial constraints).  Goal of Therapy:  Phenytoin level 10-20, after adjusted for albumin if albumin is low.  Plan:  Dilantin 1g IV bolus given in ER. Additional 350mg  IV Dilantin to be given per Neuro for a total of 15mg /kg load. Neuro has ordered a maintenance dose of 100mg  IV q8h. Neuro has ordered levels for after load today and AM dilantin level. Will f/u levels with neuro.  Gwen Her PharmD  [phone] 10/12/2011 4:33 PM

## 2011-10-12 NOTE — ED Provider Notes (Signed)
History     CSN: [id number]  Arrival date & time 10/12/11  [id number]   First MD Initiated Contact with Patient 10/12/11 0902      Chief Complaint  Patient presents with  . Seizures    (Consider location/radiation/quality/duration/timing/severity/associated sxs/prior treatment) HPI Comments: Patient brought by EMS for eval after experiencing a seizure at home.  EMS was called as the patient was not acting right and it was thought he was post-ictal.  EMS arrived and the patient refused to go with them.  They left, then were called back to the scene after he experienced another seizure.  He has been post-ictal since the second seizure and adds little history because of this.    Patient is a 20 y.o. male presenting with seizures. The history is provided by the EMS personnel.  Seizures  This is a recurrent problem. The current episode started less than 1 hour ago. There were 2 to 3 seizures.    Past Medical History  Diagnosis Date  . Seizures     No past surgical history on file.  No family history on file.  History  Substance Use Topics  . Smoking status: Current Everyday Smoker  . Smokeless tobacco: Not on file  . Alcohol Use: Yes     occasionally      Review of Systems  Unable to perform ROS Neurological: Positive for seizures.    Allergies  Review of patient's allergies indicates no known allergies.  Home Medications   Current Outpatient Rx  Name Route Sig Dispense Refill  . PHENYTOIN SODIUM EXTENDED 300 MG PO CAPS Oral Take 300 mg by mouth daily.        BP 130/98  Pulse 82  Physical Exam  Constitutional: He appears well-developed and well-nourished.       Patient responds to light in eyes, noxious stimuli.  Is protecting his airway and breathing well.  HENT:  Head: Normocephalic and atraumatic.  Right Ear: External ear normal.  Left Ear: External ear normal.  Eyes: EOM are normal. Pupils are equal, round, and reactive to light.  Neck: Normal range of  motion. Neck supple.  Cardiovascular: Normal rate and regular rhythm.  Exam reveals no friction rub.   No murmur heard. Pulmonary/Chest: Effort normal and breath sounds normal. No respiratory distress.  Abdominal: Soft. Bowel sounds are normal. He exhibits no distension. There is no tenderness.  Musculoskeletal: Normal range of motion.  Neurological:       Altered loc makes exam difficult, but moves all four extremities, will respond to voice and light in eyes, localize to pain.  Skin: Skin is warm and dry.    ED Course  Procedures (including critical care time)   Labs Reviewed  CBC  DIFFERENTIAL  BASIC METABOLIC PANEL  ETHANOL  URINE RAPID DRUG SCREEN (HOSP PERFORMED)  PHENYTOIN LEVEL, TOTAL   No results found.   No diagnosis found.    MDM  Patient having recurrent seizures requiring repeat doses of ativan.  Awaiting results of dilantin level.  Will give more if low.  Have consulted medicine for admission and treatment of status epilepticus.        Geoffery Lyons, MD 10/12/11 [phone]

## 2011-10-12 NOTE — ED Notes (Signed)
Girlfriend bedside----

## 2011-10-12 NOTE — ED Notes (Signed)
Patient transported to CT 

## 2011-10-12 NOTE — Consult Note (Signed)
[facility] CONSULT [facility]     Reason for Consult: seizure CC: seizure   HPI:    Kyle Adams is an 20 y.o. male  has a past medical history of Seizures; Asthma; and Facial trauma (04/2008). At time of consult patient is pos-ictal and unable to answer questions.  His girl friend of 4 years is at bedside.  Per girlfriend, patient has been on Dilantin 300 mg every night for over 10 years and well controlled.  Recently, due to financial hardships he has not been able to afford his dilantin (he gets his dilantin at [employer] [facility]) and skipped 4 doses.  This am his family noted 2 seizures.  Both times EMS was contacted.  Initially they were turn back but the after the second episode he was brought to [facility] ED. He has recieved 1 gram dilantin and palced on 100 mg TID.    Past Medical History  Diagnosis Date  . Seizures   . Asthma   . Facial trauma 04/2008    Past Surgical History  Procedure Date  . No past surgeries     History reviewed. No pertinent family history.  Social History:  reports that he has been smoking.  He has never used smokeless tobacco. He reports that he drinks alcohol. He reports that he uses illicit drugs (Marijuana).  No Known Allergies  Medications:    Prior to Admission:  Dilantin 300 mg every night  Scheduled:   . sodium chloride   Intravenous STAT  . sodium chloride   Intravenous STAT  . LORazepam      . LORazepam      . phenytoin (DILANTIN) IV  1,000 mg Intravenous To ER  . DISCONTD: fosPHENYtoin (CEREBYX) IV  1,000 mg PE Intravenous Once    Review of Systems - unattainable due to post-ictal state  Blood pressure 129/75, pulse 109, temperature 98.3 F (36.8 C), temperature source Oral, resp. rate 17, SpO2 99.00%.   Neurologic Examination:   Mental Status: Very lethargic and post ictal.  He will wake up briefly to vigorous stimuli but quickly drift back to sleep.   Cranial Nerves: II-Visual fields grossly  intact. III/IV/VI-Extraocular movements intact.  Pupils reactive bilaterally. V/VII-Smile symmetric VIII-grossly intact IX/X-normal gag XI-bilateral shoulder shrug XII-midline tongue extension Motor: 5/5 bilaterally with normal tone and bulk he is moving all extremities spontaneously and purposefully Sensory: Pinprick and light touch intact throughout, bilaterally Deep Tendon Reflexes: 2+ and symmetric throughout Plantars: Downgoing bilaterally   No results found for this basename: cbc, bmp, coags, chol, tri, ldl, hga1c    Results for orders placed during the hospital encounter of 10/12/11 (from the past 48 hour(s))  URINE RAPID DRUG SCREEN (HOSP PERFORMED)     Status: Abnormal   Collection Time   10/12/11  9:14 AM      Component Value Range Comment   Opiates NONE DETECTED  NONE DETECTED     Cocaine NONE DETECTED  NONE DETECTED     Benzodiazepines NONE DETECTED  NONE DETECTED     Amphetamines NONE DETECTED  NONE DETECTED     Tetrahydrocannabinol POSITIVE (*) NONE DETECTED     Barbiturates NONE DETECTED  NONE DETECTED    BASIC METABOLIC PANEL     Status: Abnormal   Collection Time   10/12/11  9:30 AM      Component Value Range Comment   Sodium  136  135 - 145 (mEq/L)    Potassium 3.8  3.5 - 5.1 (mEq/L)    Chloride 102  96 - 112 (mEq/L)    CO2 18 (*) 19 - 32 (mEq/L)    Glucose, Bld 137 (*) 70 - 99 (mg/dL)    BUN 17  6 - 23 (mg/dL)    Creatinine, Ser 8.11  0.50 - 1.35 (mg/dL)    Calcium 9.8  8.4 - 10.5 (mg/dL)    GFR calc non Af Amer >90  >90 (mL/min)    GFR calc Af Amer >90  >90 (mL/min)   ETHANOL     Status: Normal   Collection Time   10/12/11  9:30 AM      Component Value Range Comment   Alcohol, Ethyl (B) <11  0 - 11 (mg/dL)   PHENYTOIN LEVEL, TOTAL     Status: Abnormal   Collection Time   10/12/11  9:30 AM      Component Value Range Comment   Phenytoin Lvl <2.5 (*) 10.0 - 20.0 (ug/mL)   CBC     Status: Abnormal   Collection Time   10/12/11 10:15 AM       Component Value Range Comment   WBC 17.6 (*) 4.0 - 10.5 (K/uL)    RBC 5.91 (*) 4.22 - 5.81 (MIL/uL)    Hemoglobin 16.0  13.0 - 17.0 (g/dL)    HCT 91.4  78.2 - 95.6 (%)    MCV 79.9  78.0 - 100.0 (fL)    MCH 27.1  26.0 - 34.0 (pg)    MCHC 33.9  30.0 - 36.0 (g/dL)    RDW 21.3  08.6 - 57.8 (%)    Platelets 263  150 - 400 (K/uL)   DIFFERENTIAL     Status: Abnormal   Collection Time   10/12/11 10:15 AM      Component Value Range Comment   Neutrophils Relative 81 (*) 43 - 77 (%)    Neutro Abs 14.2 (*) 1.7 - 7.7 (K/uL)    Lymphocytes Relative 13  12 - 46 (%)    Lymphs Abs 2.3  0.7 - 4.0 (K/uL)    Monocytes Relative 6  3 - 12 (%)    Monocytes Absolute 1.1 (*) 0.1 - 1.0 (K/uL)    Eosinophils Relative 0  0 - 5 (%)    Eosinophils Absolute 0.0  0.0 - 0.7 (K/uL)    Basophils Relative 0  0 - 1 (%)    Basophils Absolute 0.0  0.0 - 0.1 (K/uL)     Ct Head Wo Contrast  10/12/2011  *RADIOLOGY REPORT*  Clinical Data: Seizures.  CT HEAD WITHOUT CONTRAST  Technique:  Contiguous axial images were obtained from the base of the skull through the vertex without contrast.  Comparison: CT head without contrast 05/05/2010 [facility].  Findings: No acute cortical infarct, hemorrhage, or mass lesion is present.  The ventricles are of normal size.  No significant extra- axial fluid collection is present.  Small polyps or mucous retention cysts are present in the maxillary sinuses, as before. The remaining paranasal sinuses are clear.  The osseous skull is intact.    IMPRESSION:  1.  Normal CT appearance of the brain. 2.  Small polyps or mucous retention cysts within the maxillary sinuses bilaterally.  Original Report Authenticated By: Jamesetta Orleans. MATTERN, M.D.     Assessment/Plan:    20 YO male with known seizure disorder.  Patient presents to [facility] ED with 2 seizures and sub  therapeutic dilantin level.   Recommend:  (1) Get dilantin level 2 hours after load.  (2) Place back on Dilantin 100 mg TID  IV and switch to PO when able to take oral medication.  (3) neuro checks every 4 hours  (4) Dilantin level in AM  (5) NPO until able to pass bedside swallow screen  (6) Give another 350 mg of Dilantin PO  Felicie Morn PA-C [facility] [phone]  10/12/2011, 3:55 PM

## 2011-10-12 NOTE — Progress Notes (Signed)
Dilantin nor its generic is listed on [facility] $4 list.  Provided girlfriend with a phenytoin coupon for $[facility].39 for [facility]. Reviewed with her [email or web address] website and provided [employer] pt assistance program application for dilantin.  Girlfriend voiced understanding and appreciation of services.

## 2011-10-12 NOTE — ED Notes (Signed)
Vital signs stable. 

## 2011-10-12 NOTE — ED Notes (Signed)
Per ems.  Pt found by roommate this am non responsive.  EMS arrived.  Pt was able to stand up and refused transport to ED.  After ems left pt had another seizure that was witnessed.  Roommate states seizure lasted 15 seconds.  EMs arrived again.  Pt post ictal and confused.  CBG 135.  Hx of seizures, takes phenytoin 100mg .  Has not had meds for several days.

## 2011-10-12 NOTE — Progress Notes (Signed)
ICU nurse only able to do swallow eval with stroke patients. MD made aware. Order received for Speech therapy to perform bedside swallow eval. Kyle Adams A

## 2011-10-12 NOTE — H&P (Addendum)
. PCP:   No primary provider on file.   Chief Complaint:  Seizures  HPI: 20 y/o with history of seizures.  Presented to the ED after having multiple seizures today.  Good history is hard to obtain since patient is not able to provide a history.  Reportedly was give ativan and was given a Dilantin bolus.  Person in room with patient was able to provide a brief history and stated that patient routinely skips his doses of phenytoin.  Otherwise reported that patient didn't have any other complaints. Denied any recent cough, fever, or chills.  Allergies:  No Known Allergies    Past Medical History  Diagnosis Date  . Seizures   . Asthma   . Facial trauma 04/2008    Past Surgical History  Procedure Date  . No past surgeries     Prior to Admission medications   Not on File    Social History:  reports that he has been smoking.  He has never used smokeless tobacco. He reports that he drinks alcohol. He reports that he uses illicit drugs (Marijuana).  History reviewed. No pertinent family history.  Review of Systems:  Constitutional: Denies fever, chills, diaphoresis, appetite change and fatigue.  HEENT: Denies photophobia, eye pain, redness, hearing loss, ear pain, congestion, sore throat, rhinorrhea, sneezing, mouth sores, trouble swallowing, neck pain, neck stiffness and tinnitus.   Respiratory: Denies SOB, DOE, cough, chest tightness,  and wheezing.   Cardiovascular: Denies chest pain, palpitations and leg swelling.  Gastrointestinal: Denies nausea, vomiting, abdominal pain, diarrhea, constipation, blood in stool and abdominal distention.  Genitourinary: Denies dysuria, urgency, frequency, hematuria, flank pain and difficulty urinating.  Musculoskeletal: Denies myalgias, back pain, joint swelling, arthralgias and gait problem.  Skin: Denies pallor, rash and wound.  Neurological: Denies dizziness, seizures, syncope, weakness, light-headedness, numbness and headaches.    Hematological: Denies adenopathy. Easy bruising, personal or family bleeding history  Psychiatric/Behavioral: Denies suicidal ideation, mood changes, confusion, nervousness, sleep disturbance and agitation   Physical Exam: Blood pressure 129/75, pulse 109, temperature 98.3 F (36.8 C), temperature source Oral, resp. rate 17, SpO2 99.00%. Gen: Pt is laying in bed Head: atraumatic Neck: supple Ears: normal appearance Nose: normal appearance CV: N s1 and s2 RRR Pulm:  Clear to auscultation Abd: soft, nt Extremities: no edema Neuro:  Pt is arousable to command but somnolent  Labs on Admission:  Results for orders placed during the hospital encounter of 10/12/11 (from the past 48 hour(s))  URINE RAPID DRUG SCREEN (HOSP PERFORMED)     Status: Abnormal   Collection Time   10/12/11  9:14 AM      Component Value Range Comment   Opiates NONE DETECTED  NONE DETECTED     Cocaine NONE DETECTED  NONE DETECTED     Benzodiazepines NONE DETECTED  NONE DETECTED     Amphetamines NONE DETECTED  NONE DETECTED     Tetrahydrocannabinol POSITIVE (*) NONE DETECTED     Barbiturates NONE DETECTED  NONE DETECTED    BASIC METABOLIC PANEL     Status: Abnormal   Collection Time   10/12/11  9:30 AM      Component Value Range Comment   Sodium 136  135 - 145 (mEq/L)    Potassium 3.8  3.5 - 5.1 (mEq/L)    Chloride 102  96 - 112 (mEq/L)    CO2 18 (*) 19 - 32 (mEq/L)    Glucose, Bld 137 (*) 70 - 99 (mg/dL)    BUN 17  6 - 23 (mg/dL)    Creatinine, Ser 1.61  0.50 - 1.35 (mg/dL)    Calcium 9.8  8.4 - 10.5 (mg/dL)    GFR calc non Af Amer >90  >90 (mL/min)    GFR calc Af Amer >90  >90 (mL/min)   ETHANOL     Status: Normal   Collection Time   10/12/11  9:30 AM      Component Value Range Comment   Alcohol, Ethyl (B) <11  0 - 11 (mg/dL)   PHENYTOIN LEVEL, TOTAL     Status: Abnormal   Collection Time   10/12/11  9:30 AM      Component Value Range Comment   Phenytoin Lvl <2.5 (*) 10.0 - 20.0 (ug/mL)   CBC      Status: Abnormal   Collection Time   10/12/11 10:15 AM      Component Value Range Comment   WBC 17.6 (*) 4.0 - 10.5 (K/uL)    RBC 5.91 (*) 4.22 - 5.81 (MIL/uL)    Hemoglobin 16.0  13.0 - 17.0 (g/dL)    HCT 09.6  04.5 - 40.9 (%)    MCV 79.9  78.0 - 100.0 (fL)    MCH 27.1  26.0 - 34.0 (pg)    MCHC 33.9  30.0 - 36.0 (g/dL)    RDW 81.1  91.4 - 78.2 (%)    Platelets 263  150 - 400 (K/uL)   DIFFERENTIAL     Status: Abnormal   Collection Time   10/12/11 10:15 AM      Component Value Range Comment   Neutrophils Relative 81 (*) 43 - 77 (%)    Neutro Abs 14.2 (*) 1.7 - 7.7 (K/uL)    Lymphocytes Relative 13  12 - 46 (%)    Lymphs Abs 2.3  0.7 - 4.0 (K/uL)    Monocytes Relative 6  3 - 12 (%)    Monocytes Absolute 1.1 (*) 0.1 - 1.0 (K/uL)    Eosinophils Relative 0  0 - 5 (%)    Eosinophils Absolute 0.0  0.0 - 0.7 (K/uL)    Basophils Relative 0  0 - 1 (%)    Basophils Absolute 0.0  0.0 - 0.1 (K/uL)     Radiological Exams on Admission: Ct Head Wo Contrast  10/12/2011  *RADIOLOGY REPORT*  Clinical Data: Seizures.  CT HEAD WITHOUT CONTRAST  Technique:  Contiguous axial images were obtained from the base of the skull through the vertex without contrast.  Comparison: CT head without contrast 05/05/2010 [facility].  Findings: No acute cortical infarct, hemorrhage, or mass lesion is present.  The ventricles are of normal size.  No significant extra- axial fluid collection is present.  Small polyps or mucous retention cysts are present in the maxillary sinuses, as before. The remaining paranasal sinuses are clear.  The osseous skull is intact.  IMPRESSION:  1.  Normal CT appearance of the brain. 2.  Small polyps or mucous retention cysts within the maxillary sinuses bilaterally.  Original Report Authenticated By: Jamesetta Orleans. MATTERN, M.D.    Assessment/Plan  Seizure:  Most likely related to medication withdrawal.  Will consult neuro for further evaluation and management.  Will plan on  f/u with their recommendations.  Discussed with neurologist on call and wants a 2 hour f/u dilantin level as well as albumin level.  Plan will be to place patient on phenytoin 100mg  TID.  Will not order medication for now until neurology evaluates today as indicated by our conversation.  Leukocytosis:  Most likely related to stress from seizure. Obtain a CBC for the am.   Time Spent on Admission:    Penny Pia [facility] Pager: [phone] 10/12/2011, 3:37 PM

## 2011-10-12 NOTE — ED Notes (Signed)
Patient is resting comfortably. 

## 2011-10-12 NOTE — ED Notes (Signed)
WUJ:[id number]<JY> Expected date:<BR> Expected time:<BR> Means of arrival:Ambulance<BR> Comments:<BR> ems

## 2011-10-12 NOTE — Progress Notes (Signed)
ED CM noted pt listed as self pay with no pcp. CM spoke with pt's girlfriend whom he lives with at the bedside.  Pt asleep.  She confirms he has no pcp. Pt's mother has obtained a pcp for him in [place] which per girlfriend is too far away from their residence. States pt's medicaid "went through". States she has assisted to get dilantin for pt at [facility] but cost is $40+. CM discussed importance of medicaid pcp to maintain follow up care and prescriptions.  Encouraged use of an available medicaid pcp on written list Cm provided and/or contacting [place] DSS to get a medicaid pcp assigned closer to pt' s residence.  Cm informed registration that girlfriend states pt now has medicaid.  CM provided [employer] contact information, [facility] generic medication list, information about [email or web address] and Dilantin pt assistance program application.  Previously [facility] CM staff has assisted in obtaining pt a July 2012 appointment to [facility] but he did not attend the appointment as he informed this ED CM in the ED during a 05/2011 admission.  CM will attempt to see if pt is still eligible for [facility] services and make referral as appropriate

## 2011-10-12 NOTE — ED Notes (Signed)
Patient denies pain and is resting comfortably.  

## 2011-10-12 NOTE — ED Notes (Signed)
A call placed to pt.'s room mate shequan. Room mate stated that he would contact pt.'s mother or have her contact the ED. Contact # [phone] shequan-room mate

## 2011-10-12 NOTE — ED Notes (Signed)
MD at bedside. (neurologist @ bedside)

## 2011-10-13 LAB — BASIC METABOLIC PANEL
BUN: 13 mg/dL (ref 6–23)
Chloride: 105 mEq/L (ref 96–112)
GFR calc Af Amer: >90 mL/min (ref >90)
Potassium: 3.7 mEq/L (ref 3.5–5.1)
Sodium: 139 mEq/L (ref 135–145)

## 2011-10-13 MED ORDER — PHENYTOIN SODIUM EXTENDED 100 MG PO CAPS
300.0000 mg | ORAL_CAPSULE | Freq: Every day | ORAL | Status: DC
  Filled 2011-10-13 (×2): qty 3

## 2011-10-13 MED ORDER — PHENYTOIN SODIUM EXTENDED 300 MG PO CAPS: 300.0000 mg | ORAL_CAPSULE | Freq: Every day | ORAL | Status: DC

## 2011-10-13 NOTE — Progress Notes (Signed)
Subjective: No further seizure activity.  Feels fine.  Ready to go home.  Does not have outpatient neurologist.  Objective: BP 126/76  Pulse 66  Temp(Src) 97.9 F (36.6 C) (Oral)  Resp 18  Ht 5\' 9"  (1.753 m)  Wt 88.451 kg (195 lb)  BMI 28.80 kg/m2  SpO2 96%  Intake/Output from previous day: 12/28 0701 - 12/29 0700 In: 2520 [I.V.:1520] Out: 500 [Urine:500]  Medications:  Scheduled:   . sodium chloride   Intravenous STAT  . docusate sodium  100 mg Oral BID  . heparin  5,000 Units Subcutaneous Q8H  . LORazepam      . phenytoin (DILANTIN) IV  1,000 mg Intravenous To ER  . phenytoin (DILANTIN) IV  350 mg Intravenous STAT  . phenytoin (DILANTIN) IV  100 mg Intravenous Q8H  . sodium chloride  3 mL Intravenous Q12H  . DISCONTD: sodium chloride   Intravenous STAT  . DISCONTD: fosPHENYtoin (CEREBYX) IV  1,000 mg PE Intravenous Once  . DISCONTD: phenytoin (DILANTIN) IV  350 mg Intravenous STAT    Neurologic Exam: Mental Status: Alert, oriented, thought content appropriate.  Speech fluent without evidence of aphasia. Able to follow 3 step commands without difficulty. Cranial Nerves: II- Visual fields grossly intact. III/IV/VI-Extraocular movements intact.  Pupils reactive bilaterally. V/VII-Smile symmetric VIII-hearing grossly intact XI-bilateral shoulder shrug XII-midline tongue extension Motor: 5/5 bilaterally with normal tone and bulk Sensory: Light touch intact throughout, bilaterally Deep Tendon Reflexes: 2+ and symmetric throughout Plantars: Downgoing bilaterally   Lab Results: PHENYTOIN LEVEL, TOTAL     Status: Abnormal   10/12/11  9:30 AM   Phenytoin Lvl <2.5 (*) 10.0 - 20.0 (ug/mL)   CBC     Status: Abnormal   10/12/11 10:15 AM   WBC 17.6 (*) 4.0 - 10.5 (K/uL)    RBC 5.91 (*) 4.22 - 5.81 (MIL/uL)    Hemoglobin 16.0  13.0 - 17.0 (g/dL)    HCT 91.4  78.2 - 95.6 (%)    MCV 79.9  78.0 - 100.0 (fL)    MCH 27.1  26.0 - 34.0 (pg)    MCHC 33.9  30.0 - 36.0 (g/dL)      RDW 21.3  08.6 - 15.5 (%)    Platelets 263  150 - 400 (K/uL)   DIFFERENTIAL     Status: Abnormal   10/12/11 10:15 AM   Neutrophils Relative 81 (*) 43 - 77 (%)    Neutro Abs 14.2 (*) 1.7 - 7.7 (K/uL)    Lymphocytes Relative 13  12 - 46 (%)    Lymphs Abs 2.3  0.7 - 4.0 (K/uL)    Monocytes Relative 6  3 - 12 (%)    Monocytes Absolute 1.1 (*) 0.1 - 1.0 (K/uL)    Eosinophils Relative 0  0 - 5 (%)    Eosinophils Absolute 0.0  0.0 - 0.7 (K/uL)    Basophils Relative 0  0 - 1 (%)    Basophils Absolute 0.0  0.0 - 0.1 (K/uL)   PHENYTOIN LEVEL, TOTAL     Status: Normal   10/12/11  4:38 PM   Phenytoin Lvl 14.0  10.0 - 20.0 (ug/mL)   ALBUMIN     Status: Normal   10/12/11  4:38 PM   Albumin 4.0  3.5 - 5.2 (g/dL)   BASIC METABOLIC PANEL     Status: Normal   10/13/11  3:45 AM   Sodium 139  135 - 145 (mEq/L)    Potassium 3.7  3.5 - 5.1 (mEq/L)  Chloride 105  96 - 112 (mEq/L)    CO2 21  19 - 32 (mEq/L)    Glucose, Bld 91  70 - 99 (mg/dL)    BUN 13  6 - 23 (mg/dL)    Creatinine, Ser 1.61  0.50 - 1.35 (mg/dL)    Calcium 9.3  8.4 - 10.5 (mg/dL)    GFR calc non Af Amer >90  >90 (mL/min)    GFR calc Af Amer >90  >90 (mL/min)     Studies/Results: 10/12/2011 CT HEAD WITHOUT CONTRAST Findings: No acute cortical infarct, hemorrhage, or mass lesion is present.  The ventricles are of normal size.  No significant extra- axial fluid collection is present.  Small polyps or mucous retention cysts are present in the maxillary sinuses, as before. The remaining paranasal sinuses are clear.  The osseous skull is intact.  IMPRESSION:  1.  Normal CT appearance of the brain. 2.  Small polyps or mucous retention cysts within the maxillary sinuses bilaterally.  Original Report Authenticated By: Jamesetta Orleans. MATTERN, M.D.    Assessment/Plan: 20 yo AA male with known seizure disorder. S/P 2 seizures and sub therapeutic dilantin level 10/12/11, 14.0.  Dilantin level now therapeutic.  No further seizures.  Pt has  returned to baseline mental status.  Recommend:  1.  Dilantin 300 mg PO QHS, patient has financial distrass and can not afford.  Will give info re patient assistance.  Advised to apply for disability. 2. No driving, operating heavy machinery, swimming unsupervised or taking baths alone for 6-12 months, or per op physician recommendations.  3. OP neurology follow up in 4-6 weeks.   No further neurologic intervention is recommended at this time.  If further questions arise, please call or page at that time.  Thank you for allowing neurology to participate in the care of this patient.    LOS: 1 day   Jana Hakim [facility] [phone] 10/13/2011  9:56 AM

## 2011-10-13 NOTE — Progress Notes (Signed)
Speech Language/Pathology Clinical/Bedside Swallow Evaluation Patient Details  Name: Kyle Adams MRN: [id number] DOB: Oct 28, 1990 Today's Date: 10/13/2011  Past Medical History:  Past Medical History  Diagnosis Date  . Seizures   . Asthma   . Facial trauma 04/2008   Past Surgical History:  Past Surgical History  Procedure Date  . No past surgeries    HPI:  20 yr old admitted due to seizures.  PMH:  seizure disorder, asthma, facial trauma   Assessment/Recommendations/Treatment Plan   SLP Assessment Clinical Impression Statement: Pt. exhibited functional oroopharyngeal phases of swallow.  Slight cough with thin once after cracker presentation without further instances with cup and straw sips.  Pt. complained of lingual soreness from trauma during his seizure resulting in slower mastication and manipulation.  Pt. appears safe with regular texture and thin liquids. Risk for Aspiration: Mild  Recommendations Solid Consistency: Regular Liquid Consistency: Thin  Liquid Administration via: Straw;Cup Medication Administration: Whole meds with liquid Supervision: Patient able to self feed Compensations: Slow rate;Small sips/bites;Check for pocketing Postural Changes and/or Swallow Maneuvers: Seated upright 90 degrees Follow up Recommendations: None  Treatment Plan Treatment Plan Recommendations: No treatment recommended at this time     Individuals Consulted Consulted and Agree with Results and Recommendations: Patient;Family member/caregiver  Swallow Study Prior Functional Status Pt. Reports no difficulty with swallowing prior to admit    General  HPI: 20 yr old admitted due to seizures.  PMH:  seizure disorder, asthma, facial trauma Type of Study: Bedside swallow evaluation Diet Prior to this Study: NPO Respiratory Status: Room air Behavior/Cognition: Alert;Cooperative Oral Cavity - Dentition: Adequate natural dentition Vision: Functional for self-feeding Patient  Positioning: Upright in bed Baseline Vocal Quality: Normal Ice chips: Not tested  Oral Motor/Sensory Function  Labial ROM: Within Functional Limits Labial Symmetry: Within Functional Limits Labial Strength: Within Functional Limits Lingual ROM: Other (Comment) (TRAUMA DURING SEIZURE.  SLOWER MOVEMENTS. C/O SORENESS) Facial ROM: Within Functional Limits Facial Symmetry: Within Functional Limits Facial Strength: Within Functional Limits Facial Sensation: Within Functional Limits Velum: Within Functional Limits Mandible: Within Functional Limits  Consistency Results  Ice Chips Ice chips: Not tested  Thin Liquid Thin Liquid: Impaired Pharyngeal  Phase Impairments: Cough - Delayed;Other (comments) (SLIGHT COUGH X 1 AFTER CRACKER)  Nectar Thick Liquid Nectar Thick Liquid: Not tested  Honey Thick Liquid Honey Thick Liquid: Not tested  Puree Puree: Within functional limits  Solid Solid: Impaired Oral Phase Impairments: Reduced lingual movement/coordination Oral Phase Functional Implications: Other (comment) (DUE TO LINGUAL SORENESS )   Kyle Adams Kyle Adams M.Ed [employer] [phone]  10/13/2011

## 2011-10-13 NOTE — Discharge Summary (Signed)
Patient ID: Kyle Adams MRN: [id number] DOB/AGE: 01/14/1991 20 y.o. Primary Care Physician:No primary provider on file. Admit date: 10/12/2011 Discharge date: 10/13/2011    Discharge Diagnoses:  Seizure disorder  Medication noncompliance    Active Problems:  Seizure   Medication List  As of 10/13/2011  4:27 PM   CHANGE how you take these medications         phenytoin 300 MG ER capsule   Commonly known as: DILANTIN   Take 1 capsule (300 mg total) by mouth at bedtime.   What changed: - dose - how often to take the med         STOP taking these medications         ibuprofen 200 MG tablet          Where to get your medications    These are the prescriptions that you need to pick up.   You may get these medications from any pharmacy.         phenytoin 300 MG ER capsule            Discharged Condition:  Stable    Consults: Neurology  Significant Diagnostic Studies: Ct Head Wo Contrast  10/12/2011  *RADIOLOGY REPORT*  Clinical Data: Seizures.  CT HEAD WITHOUT CONTRAST  Technique:  Contiguous axial images were obtained from the base of the skull through the vertex without contrast.  Comparison: CT head without contrast 05/05/2010 [facility].  Findings: No acute cortical infarct, hemorrhage, or mass lesion is present.  The ventricles are of normal size.  No significant extra- axial fluid collection is present.  Small polyps or mucous retention cysts are present in the maxillary sinuses, as before. The remaining paranasal sinuses are clear.  The osseous skull is intact.  IMPRESSION:  1.  Normal CT appearance of the brain. 2.  Small polyps or mucous retention cysts within the maxillary sinuses bilaterally.  Original Report Authenticated By: Jamesetta Orleans. MATTERN, M.D.    Lab Results: Results for orders placed during the hospital encounter of 10/12/11 (from the past 48 hour(s))  URINE RAPID DRUG SCREEN (HOSP PERFORMED)     Status: Abnormal   Collection Time   10/12/11  9:14 AM      Component Value Range Comment   Opiates NONE DETECTED  NONE DETECTED     Cocaine NONE DETECTED  NONE DETECTED     Benzodiazepines NONE DETECTED  NONE DETECTED     Amphetamines NONE DETECTED  NONE DETECTED     Tetrahydrocannabinol POSITIVE (*) NONE DETECTED     Barbiturates NONE DETECTED  NONE DETECTED    BASIC METABOLIC PANEL     Status: Abnormal   Collection Time   10/12/11  9:30 AM      Component Value Range Comment   Sodium 136  135 - 145 (mEq/L)    Potassium 3.8  3.5 - 5.1 (mEq/L)    Chloride 102  96 - 112 (mEq/L)    CO2 18 (*) 19 - 32 (mEq/L)    Glucose, Bld 137 (*) 70 - 99 (mg/dL)    BUN 17  6 - 23 (mg/dL)    Creatinine, Ser 4.09  0.50 - 1.35 (mg/dL)    Calcium 9.8  8.4 - 10.5 (mg/dL)    GFR calc non Af Amer >90  >90 (mL/min)    GFR calc Af Amer >90  >90 (mL/min)   ETHANOL     Status: Normal   Collection Time   10/12/11  9:30  AM      Component Value Range Comment   Alcohol, Ethyl (B) <11  0 - 11 (mg/dL)   PHENYTOIN LEVEL, TOTAL     Status: Abnormal   Collection Time   10/12/11  9:30 AM      Component Value Range Comment   Phenytoin Lvl <2.5 (*) 10.0 - 20.0 (ug/mL)   CBC     Status: Abnormal   Collection Time   10/12/11 10:15 AM      Component Value Range Comment   WBC 17.6 (*) 4.0 - 10.5 (K/uL)    RBC 5.91 (*) 4.22 - 5.81 (MIL/uL)    Hemoglobin 16.0  13.0 - 17.0 (g/dL)    HCT 21.3  08.6 - 57.8 (%)    MCV 79.9  78.0 - 100.0 (fL)    MCH 27.1  26.0 - 34.0 (pg)    MCHC 33.9  30.0 - 36.0 (g/dL)    RDW 46.9  62.9 - 52.8 (%)    Platelets 263  150 - 400 (K/uL)   DIFFERENTIAL     Status: Abnormal   Collection Time   10/12/11 10:15 AM      Component Value Range Comment   Neutrophils Relative 81 (*) 43 - 77 (%)    Neutro Abs 14.2 (*) 1.7 - 7.7 (K/uL)    Lymphocytes Relative 13  12 - 46 (%)    Lymphs Abs 2.3  0.7 - 4.0 (K/uL)    Monocytes Relative 6  3 - 12 (%)    Monocytes Absolute 1.1 (*) 0.1 - 1.0 (K/uL)    Eosinophils  Relative 0  0 - 5 (%)    Eosinophils Absolute 0.0  0.0 - 0.7 (K/uL)    Basophils Relative 0  0 - 1 (%)    Basophils Absolute 0.0  0.0 - 0.1 (K/uL)   PHENYTOIN LEVEL, TOTAL     Status: Normal   Collection Time   10/12/11  4:38 PM      Component Value Range Comment   Phenytoin Lvl 14.0  10.0 - 20.0 (ug/mL)   ALBUMIN     Status: Normal   Collection Time   10/12/11  4:38 PM      Component Value Range Comment   Albumin 4.0  3.5 - 5.2 (g/dL)   BASIC METABOLIC PANEL     Status: Normal   Collection Time   10/13/11  3:45 AM      Component Value Range Comment   Sodium 139  135 - 145 (mEq/L)    Potassium 3.7  3.5 - 5.1 (mEq/L)    Chloride 105  96 - 112 (mEq/L)    CO2 21  19 - 32 (mEq/L)    Glucose, Bld 91  70 - 99 (mg/dL)    BUN 13  6 - 23 (mg/dL)    Creatinine, Ser 4.13  0.50 - 1.35 (mg/dL)    Calcium 9.3  8.4 - 10.5 (mg/dL)    GFR calc non Af Amer >90  >90 (mL/min)    GFR calc Af Amer >90  >90 (mL/min)   PHENYTOIN LEVEL, TOTAL     Status: Normal   Collection Time   10/13/11  3:45 AM      Component Value Range Comment   Phenytoin Lvl 16.2  10.0 - 20.0 (ug/mL)    No results found for this or any previous visit (from the past 240 hour(s)).   Hospital Course:Hospital course and laboratory data -  20 year old African American patient who has a history  of a chronic seizure disorder. He also has a history of substance abuse as well as noncompliance with his medications. Due to financial issues he has been out of his Dilantin for some time his Dilantin level was less than 2.5 on admission. He states that he was using anti-seizure medications obtained from friends due to his lack of Dilantin therapy. He was brought to the ED after several seizures on the day of admission   The patient was initially treated with IV Dilantin and after more alert from his postictal state was placed on oral Dilantin. Prior to his discharge Dilantin levels were 14 and 16 respectively.      Discharge Exam:   Alert and oriented with normal affect;  Neuro  exam nonfocal Blood pressure 112/65, pulse 61, temperature 97.7 F (36.5 C), temperature source Oral, resp. rate 18, height 5\' 9"  (1.753 m), weight 88.451 kg (195 lb), SpO2 98.00%.  Disposition:  The patient will establish with a primary care provider or return to he'll certainly history for his followup care. Compliance with his medication stressed    Discharge Orders    Future Orders Please Complete By Expires   Diet - low sodium heart healthy      Increase activity slowly           Signed: Rogelia Boga 10/13/2011, 4:27 PM

## 2011-10-13 NOTE — Progress Notes (Signed)
Removed patients IV and explained before he could be discharged, the MD would need to complete discharge paperwork, we would discuss discharge instructions and he (patient) would be provided with a copy of discharge paperwork and prescriptions if applicable.  Returned to patient room approximately 10 minutes later and patient as well as belongings were gone.  Patient apparently left without discharge paperwork / instructions / prescriptions.  MD and CN made aware situation.

## 2011-10-18 ENCOUNTER — Emergency Department (HOSPITAL_COMMUNITY): Payer: Self-pay

## 2011-10-18 ENCOUNTER — Emergency Department (HOSPITAL_COMMUNITY)
Admission: EM | Admit: 2011-10-18 | Discharge: 2011-10-18 | Disposition: A | Discharge status: Home / Self Care | Payer: Self-pay | Attending: Emergency Medicine | Admitting: Emergency Medicine

## 2011-10-18 DIAGNOSIS — R059 Cough, unspecified: Secondary | ICD-10-CM | POA: Insufficient documentation

## 2011-10-18 DIAGNOSIS — J45909 Unspecified asthma, uncomplicated: Secondary | ICD-10-CM | POA: Insufficient documentation

## 2011-10-18 DIAGNOSIS — R070 Pain in throat: Secondary | ICD-10-CM | POA: Insufficient documentation

## 2011-10-18 DIAGNOSIS — R509 Fever, unspecified: Secondary | ICD-10-CM | POA: Insufficient documentation

## 2011-10-18 DIAGNOSIS — R079 Chest pain, unspecified: Secondary | ICD-10-CM | POA: Insufficient documentation

## 2011-10-18 DIAGNOSIS — F17200 Nicotine dependence, unspecified, uncomplicated: Secondary | ICD-10-CM | POA: Insufficient documentation

## 2011-10-18 DIAGNOSIS — R5381 Other malaise: Secondary | ICD-10-CM | POA: Insufficient documentation

## 2011-10-18 DIAGNOSIS — J40 Bronchitis, not specified as acute or chronic: Secondary | ICD-10-CM | POA: Insufficient documentation

## 2011-10-18 DIAGNOSIS — R05 Cough: Secondary | ICD-10-CM | POA: Insufficient documentation

## 2011-10-18 DIAGNOSIS — Z79899 Other long term (current) drug therapy: Secondary | ICD-10-CM | POA: Insufficient documentation

## 2011-10-18 DIAGNOSIS — R51 Headache: Secondary | ICD-10-CM | POA: Insufficient documentation

## 2011-10-18 LAB — RAPID STREP SCREEN (MED CTR MEBANE ONLY): Streptococcus, Group A Screen (Direct): NEGATIVE

## 2011-10-18 MED ORDER — AZITHROMYCIN 250 MG PO TABS: 500.0000 mg | ORAL_TABLET | Freq: Once | ORAL | Status: AC

## 2011-10-18 NOTE — ED Notes (Signed)
C/o head, chest and arm pain that began last week, and fever that started last night.

## 2011-10-18 NOTE — ED Provider Notes (Signed)
History     CSN: [id number]  Arrival date & time 10/18/11  1418   First MD Initiated Contact with Patient 10/18/11 1539      Chief Complaint  Patient presents with  . Headache    states that he was here a week ago and had a seizure and is feeling the same now as before he had the seizure before    (Consider location/radiation/quality/duration/timing/severity/associated sxs/prior treatment) HPI Comments: Patient here with a 6 day history of fever, headache, cough and chills.  States fever has been as high as 103 - states nyquil will keep the fever down - denies nausea, vomiting - states is taking dilantin - just got out of the hospital last Saturday - states symptoms started then.  Patient is a 21 y.o. male presenting with headaches. The history is provided by the patient. No language interpreter was used.  Headache  This is a new problem. The current episode started more than 2 days ago. The problem occurs constantly. The problem has not changed since onset.The headache is associated with nothing. The pain is located in the frontal region. The quality of the pain is described as throbbing and dull. The pain is at a severity of 8/10. The pain is severe. The pain does not radiate. Associated symptoms include a fever and malaise/fatigue. Pertinent negatives include no anorexia, no chest pressure, no near-syncope, no palpitations, no syncope, no shortness of breath, no nausea and no vomiting. He has tried NSAIDs for the symptoms. The treatment provided mild relief.    Past Medical History  Diagnosis Date  . Seizures   . Asthma   . Facial trauma 04/2008    Past Surgical History  Procedure Date  . No past surgeries     No family history on file.  History  Substance Use Topics  . Smoking status: Current Everyday Smoker -- 0.2 packs/day for 10 years  . Smokeless tobacco: Never Used  . Alcohol Use: Yes     occasionally      Review of Systems  Constitutional: Positive for fever  and malaise/fatigue.  Respiratory: Negative for shortness of breath.   Cardiovascular: Negative for palpitations, syncope and near-syncope.  Gastrointestinal: Negative for nausea, vomiting and anorexia.  Neurological: Positive for headaches.  All other systems reviewed and are negative.    Allergies  Review of patient's allergies indicates no known allergies.  Home Medications   Current Outpatient Rx  Name Route Sig Dispense Refill  . PHENYTOIN SODIUM EXTENDED 300 MG PO CAPS Oral Take 1 capsule (300 mg total) by mouth at bedtime. 90 capsule 6  . NYQUIL PO Oral Take 10 mLs by mouth daily as needed. For "feeling sick".       BP 118/72  Pulse 89  Temp(Src) 98.8 F (37.1 C) (Oral)  Resp 12  SpO2 97%  Physical Exam  Nursing note and vitals reviewed. Constitutional: He is oriented to person, place, and time. He appears well-developed and well-nourished. No distress.  HENT:  Head: Normocephalic and atraumatic.  Right Ear: External ear normal.  Left Ear: External ear normal.  Nose: Nose normal.  Mouth/Throat: Oropharynx is clear and moist. No oropharyngeal exudate.  Eyes: Conjunctivae are normal. Pupils are equal, round, and reactive to light. No scleral icterus.  Neck: Normal range of motion. Neck supple. No Brudzinski's sign and no Kernig's sign noted.  Cardiovascular: Normal rate, regular rhythm and normal heart sounds.  Exam reveals no gallop and no friction rub.   No murmur heard. Pulmonary/Chest:  Effort normal and breath sounds normal. No respiratory distress. He has no wheezes. He has no rales. He exhibits no tenderness.  Abdominal: Soft. Bowel sounds are normal. He exhibits no distension. There is no tenderness.  Musculoskeletal: Normal range of motion.  Lymphadenopathy:    He has no cervical adenopathy.  Neurological: He is alert and oriented to person, place, and time. No cranial nerve deficit. Coordination normal.  Skin: Skin is warm and dry.  Psychiatric: He has a  normal mood and affect. His behavior is normal. Judgment and thought content normal.    ED Course  Procedures (including critical care time)   Labs Reviewed  RAPID STREP SCREEN   Dg Chest 2 View  10/18/2011  *RADIOLOGY REPORT*  Clinical Data:  Chest pain, cough and sore throat.  CHEST - 2 VIEW  Comparison: 09/04/2009  Findings: The heart size and mediastinal contours are within normal limits.  Both lungs are clear.  The visualized skeletal structures are unremarkable.  IMPRESSION: No active disease.  Original Report Authenticated By: Reola Calkins, M.D.     Bronchitis    MDM  Patient smoker with productive cough, fever and headache - has been taking dilantin and reports no seizures since leaving the hospital - no nausea, vomiting - less likely is influenza, meningitis as length of symptoms.        Izola Price [place], [place] 10/18/11 1652

## 2011-10-19 NOTE — ED Provider Notes (Signed)
Medical screening examination/treatment/procedure(s) were performed by non-physician practitioner and as supervising physician I was immediately available for consultation/collaboration.   Zaine Elsass, MD 10/19/11 0108 

## 2012-06-13 ENCOUNTER — Emergency Department (HOSPITAL_COMMUNITY)
Admission: EM | Admit: 2012-06-13 | Discharge: 2012-06-13 | Disposition: A | Discharge status: Home / Self Care | Payer: Self-pay | Attending: Emergency Medicine | Admitting: Emergency Medicine

## 2012-06-13 ENCOUNTER — Encounter (HOSPITAL_COMMUNITY): Payer: Self-pay | Admitting: Emergency Medicine

## 2012-06-13 DIAGNOSIS — Z9114 Patient's other noncompliance with medication regimen: Secondary | ICD-10-CM

## 2012-06-13 DIAGNOSIS — R569 Unspecified convulsions: Secondary | ICD-10-CM | POA: Insufficient documentation

## 2012-06-13 DIAGNOSIS — Z9119 Patient's noncompliance with other medical treatment and regimen: Secondary | ICD-10-CM | POA: Insufficient documentation

## 2012-06-13 DIAGNOSIS — F17200 Nicotine dependence, unspecified, uncomplicated: Secondary | ICD-10-CM | POA: Insufficient documentation

## 2012-06-13 DIAGNOSIS — Z91199 Patient's noncompliance with other medical treatment and regimen due to unspecified reason: Secondary | ICD-10-CM | POA: Insufficient documentation

## 2012-06-13 MED ORDER — SODIUM CHLORIDE 0.9 % IV SOLN
1500.0000 mg | Freq: Once | INTRAVENOUS | Status: AC
  Administered 2012-06-13: 1500 mg via INTRAVENOUS
  Filled 2012-06-13: qty 30

## 2012-06-13 MED ORDER — PHENYTOIN SODIUM EXTENDED 300 MG PO CAPS: 300.0000 mg | ORAL_CAPSULE | Freq: Every day | ORAL | Status: DC

## 2012-06-13 NOTE — ED Notes (Signed)
WUJ:[id number]<JY> Expected date:06/13/12<BR> Expected time: 8:39 AM<BR> Means of arrival:Ambulance<BR> Comments:<BR> Seizure witnessed by family 28 male

## 2012-06-13 NOTE — ED Notes (Signed)
Pt from home.  Family states that pt had a 10 minute seizure this am.  Was post ictal when EMS arrived. No injury to tongue or incontinence noted.  Pt has hx of seizures and has been out of dilantin for the past week. Pt does not remember seizure.  States he woke up and he was being taken to the hospital.  Pt awake and alert.  Refuses heart monitor.

## 2012-06-13 NOTE — ED Notes (Signed)
Reduced rate of dilantin to 51ml/hr per pt comfort. Pt states it feels better.

## 2012-06-13 NOTE — ED Provider Notes (Signed)
History     CSN: [id number]  Arrival date & time 06/13/12  0855   First MD Initiated Contact with Patient 06/13/12 0930      Chief Complaint  Patient presents with  . Seizures   level V caveat due to altered mental status.  (Consider location/radiation/quality/duration/timing/severity/associated sxs/prior treatment) Patient is a 21 y.o. male presenting with seizures. The history is provided by the patient and a relative.  Seizures  This is a recurrent problem.   patient presents with a tonic-clonic seizure from home. He has a history of seizures and has been out of his Dilantin for 2 weeks. Per the family members the patient has been unable to get Medicaid to do his age and now cannot see Dr. Patient is still postictal.  Past Medical History  Diagnosis Date  . Seizures   . Asthma   . Facial trauma 04/2008    Past Surgical History  Procedure Date  . No past surgeries     History reviewed. No pertinent family history.  History  Substance Use Topics  . Smoking status: Current Everyday Smoker -- 0.2 packs/day for 10 years  . Smokeless tobacco: Never Used  . Alcohol Use: Yes     occasionally      Review of Systems  Unable to perform ROS Neurological: Positive for seizures.    Allergies  Review of patient's allergies indicates no known allergies.  Home Medications   Current Outpatient Rx  Name Route Sig Dispense Refill  . PHENYTOIN SODIUM EXTENDED 300 MG PO CAPS Oral Take 1 capsule (300 mg total) by mouth at bedtime. 90 capsule 0    BP 127/74  Pulse 67  Temp 98.5 F (36.9 C) (Oral)  Resp 16  SpO2 100%  Physical Exam  Constitutional: He appears well-developed.  HENT:  Head: Normocephalic.  Eyes: Pupils are equal, round, and reactive to light.  Neck: Normal range of motion.  Cardiovascular: Normal rate and regular rhythm.   Pulmonary/Chest: Effort normal.  Abdominal: Soft.  Musculoskeletal: Normal range of motion.  Neurological:       Patient will  wake up, but is still sedate    ED Course  Procedures (including critical care time)  Labs Reviewed - No data to display No results found.   1. Seizure   2. Noncompliance with medications       MDM  Patient with seizure and noncompliance with Dilantin. Patient was loaded on Dilantin in his mental status improved. No clear injury from the seizure and patient was discharged home. He was given a prescription for more Dilantin.        Juliet Rude. Rubin Payor, MD 06/13/12 [phone]

## 2012-06-13 NOTE — ED Notes (Signed)
Pt provided information sheet with community resources for MD services and meds.

## 2012-06-13 NOTE — ED Notes (Signed)
Estimated finish time on dilantin 1415

## 2012-06-13 NOTE — ED Notes (Signed)
Patient being verbally abusive to this Clinical research associate. Stating to get out of his room. That this writer is annoying him. That this writer will not talk to him like that. RN, Fleet Contras in room at this time. Seizure pads have been placed. Was successful in getting patient into a gown. Patient stating "Just let me go. I don't want to be here. Just let me sleep. That's all I need." MD was called to come see patient.

## 2012-06-13 NOTE — ED Notes (Signed)
Pt is aggressive when asked to get back in bed for safety reasons, pt standing in hallway, mother with aggressive behavior, informed again is for safety reasons and a urinal will be provided, pt assisted back to bed, rails & pads in place, pt provided urinal.

## 2012-08-18 ENCOUNTER — Encounter (HOSPITAL_COMMUNITY): Payer: Self-pay | Admitting: *Deleted

## 2012-08-18 ENCOUNTER — Emergency Department (HOSPITAL_COMMUNITY)
Admission: EM | Admit: 2012-08-18 | Discharge: 2012-08-18 | Discharge status: Against Medical Advice | Payer: Self-pay | Attending: Emergency Medicine | Admitting: Emergency Medicine

## 2012-08-18 DIAGNOSIS — R569 Unspecified convulsions: Secondary | ICD-10-CM | POA: Insufficient documentation

## 2012-08-18 NOTE — ED Notes (Signed)
Pt called x 1 in waiting room and no answer

## 2012-08-18 NOTE — ED Notes (Signed)
Pt called x 2 and no answer

## 2012-08-18 NOTE — ED Notes (Signed)
Pt started having seizures 3 years ago and then started taken dilantin.  Pt last sz was in August, Pt girlfriend told him he was having possible seizure yesterday morning.  No diabetes

## 2012-08-26 ENCOUNTER — Emergency Department (HOSPITAL_COMMUNITY)
Admission: EM | Admit: 2012-08-26 | Discharge: 2012-08-26 | Disposition: A | Discharge status: Home / Self Care | Payer: Self-pay | Attending: Emergency Medicine | Admitting: Emergency Medicine

## 2012-08-26 ENCOUNTER — Encounter (HOSPITAL_COMMUNITY): Payer: Self-pay | Admitting: *Deleted

## 2012-08-26 DIAGNOSIS — R7889 Finding of other specified substances, not normally found in blood: Secondary | ICD-10-CM

## 2012-08-26 DIAGNOSIS — F17200 Nicotine dependence, unspecified, uncomplicated: Secondary | ICD-10-CM | POA: Insufficient documentation

## 2012-08-26 DIAGNOSIS — Z79899 Other long term (current) drug therapy: Secondary | ICD-10-CM | POA: Insufficient documentation

## 2012-08-26 DIAGNOSIS — G40909 Epilepsy, unspecified, not intractable, without status epilepticus: Secondary | ICD-10-CM | POA: Insufficient documentation

## 2012-08-26 DIAGNOSIS — J45909 Unspecified asthma, uncomplicated: Secondary | ICD-10-CM | POA: Insufficient documentation

## 2012-08-26 DIAGNOSIS — R569 Unspecified convulsions: Secondary | ICD-10-CM

## 2012-08-26 DIAGNOSIS — R7989 Other specified abnormal findings of blood chemistry: Secondary | ICD-10-CM | POA: Insufficient documentation

## 2012-08-26 LAB — COMPREHENSIVE METABOLIC PANEL
Albumin: 4 g/dL (ref 3.5–5.2)
BUN: 16 mg/dL (ref 6–23)
Calcium: 9.4 mg/dL (ref 8.4–10.5)
Chloride: 102 mEq/L (ref 96–112)
Creatinine, Ser: 0.79 mg/dL (ref 0.50–1.35)
GFR calc non Af Amer: >90 mL/min (ref >90)
Total Bilirubin: 0.2 mg/dL — ABNORMAL LOW (ref 0.3–1.2)

## 2012-08-26 LAB — CBC WITH DIFFERENTIAL/PLATELET
Basophils Absolute: 0 10*3/uL (ref 0.0–0.1)
Basophils Relative: 0 % (ref 0–1)
Eosinophils Relative: 4 % (ref 0–5)
HCT: 42.5 % (ref 39.0–52.0)
Hemoglobin: 14.6 g/dL (ref 13.0–17.0)
MCH: 27.2 pg (ref 26.0–34.0)
MCHC: 34.4 g/dL (ref 30.0–36.0)
MCV: 79.1 fL (ref 78.0–100.0)
Monocytes Absolute: 0.3 10*3/uL (ref 0.1–1.0)
Monocytes Relative: 6 % (ref 3–12)
Neutro Abs: 3.2 10*3/uL (ref 1.7–7.7)
RDW: 14.6 % (ref 11.5–15.5)

## 2012-08-26 LAB — URINALYSIS, ROUTINE W REFLEX MICROSCOPIC
Bilirubin Urine: NEGATIVE
Hgb urine dipstick: NEGATIVE
Ketones, ur: NEGATIVE mg/dL
Specific Gravity, Urine: 1.025 (ref 1.005–1.030)
pH: 6 (ref 5.0–8.0)

## 2012-08-26 LAB — RAPID URINE DRUG SCREEN, HOSP PERFORMED
Benzodiazepines: NOT DETECTED
Cocaine: NOT DETECTED
Opiates: NOT DETECTED

## 2012-08-26 MED ORDER — PHENYTOIN SODIUM EXTENDED 100 MG PO CAPS
100.0000 mg | ORAL_CAPSULE | Freq: Once | ORAL | Status: AC
  Administered 2012-08-26: 100 mg via ORAL
  Filled 2012-08-26: qty 1

## 2012-08-26 MED ORDER — PHENYTOIN SODIUM EXTENDED 100 MG PO CAPS: 300.0000 mg | ORAL_CAPSULE | Freq: Every day | ORAL | Status: DC

## 2012-08-26 MED ORDER — PHENYTOIN SODIUM EXTENDED 100 MG PO CAPS
400.0000 mg | ORAL_CAPSULE | Freq: Once | ORAL | Status: AC
  Administered 2012-08-26: 400 mg via ORAL
  Filled 2012-08-26: qty 4

## 2012-08-26 NOTE — ED Notes (Addendum)
Pt reports hx of seizures. Takes phenytoin. Pt did not take medications last night, usually takes every night. Pt had not taken medication this morning, and pt and girlfriend reports "pt locked up",   locked for 10 minutes, LOC, woke up in the bed with a "terrible headache". 10/10 headache, and reports of nausea. Pt reports he has "been locking up for the last week often".   Has not had blood work done since April

## 2012-08-26 NOTE — ED Notes (Signed)
PA Kirichenko at bedside 

## 2012-08-26 NOTE — ED Provider Notes (Signed)
Medical screening examination/treatment/procedure(s) were performed by non-physician practitioner and as supervising physician I was immediately available for consultation/collaboration.  Jones Skene, M.D.     Jones Skene, MD 08/26/12 1820

## 2012-08-26 NOTE — ED Provider Notes (Signed)
History     CSN: [id number]  Arrival date & time 08/26/12  1111   First MD Initiated Contact with Patient 08/26/12 1214      No chief complaint on file.   (Consider location/radiation/quality/duration/timing/severity/associated sxs/prior treatment) HPI Comments: Kyle Adams is a 21 y.o. Male who presents with complaints of "small seizures" at home for the last week. Hx provided by pt and his wife. Per wife, pt's whole body would "tighten up" and lasts few seconds, after which time he is confused. Pt has no recollection of episodes.  Pt has hx of seizures, but no PCP or neurologist. Pt had two of these episodes in last week. Pt has no loss of bladder or bowel control, no body jerking movements. Pt takes dilantin, he does admit to missing few doses in the last week. Pt has no other complaints, no headache, or head injury, no n/v, fever, chills, malaise.    Past Medical History  Diagnosis Date  . Seizures   . Asthma   . Facial trauma 04/2008    Past Surgical History  Procedure Date  . No past surgeries     History reviewed. No pertinent family history.  History  Substance Use Topics  . Smoking status: Current Every Day Smoker -- 0.2 packs/day for 10 years  . Smokeless tobacco: Never Used  . Alcohol Use: Yes     Comment: occasionally      Review of Systems  Neurological: Positive for seizures.  All other systems reviewed and are negative.    Allergies  Review of patient's allergies indicates no known allergies.  Home Medications   Current Outpatient Rx  Name  Route  Sig  Dispense  Refill  . PHENYTOIN SODIUM EXTENDED 300 MG PO CAPS   Oral   Take 1 capsule (300 mg total) by mouth at bedtime.   90 capsule   0     BP 131/81  Pulse 61  Temp 98 F (36.7 C) (Oral)  Resp 18  SpO2 98%  Physical Exam  Nursing note and vitals reviewed. Constitutional: He is oriented to person, place, and time. He appears well-developed and well-nourished. No distress.    HENT:  Head: Normocephalic and atraumatic.  Right Ear: External ear normal.  Left Ear: External ear normal.  Nose: Nose normal.  Mouth/Throat: Oropharynx is clear and moist.  Eyes: Conjunctivae normal and EOM are normal. Pupils are equal, round, and reactive to light.  Neck: Neck supple.  Cardiovascular: Normal rate, regular rhythm and normal heart sounds.   Pulmonary/Chest: Effort normal and breath sounds normal. No respiratory distress. He has no wheezes. He has no rales.  Abdominal: Soft. Bowel sounds are normal.  Musculoskeletal: He exhibits no edema.  Neurological: He is alert and oriented to person, place, and time. He has normal reflexes. No cranial nerve deficit. He exhibits normal muscle tone. Coordination normal.  Skin: Skin is warm and dry.  Psychiatric: He has a normal mood and affect.    ED Course  Procedures (including critical care time)  Pt with possible seizures at home, admits to missing doses of dilantin. Will check levels, electrolytes, blood counts, drug screen. Pt in NAD. VS normal.   Results for orders placed during the hospital encounter of 08/26/12  CBC WITH DIFFERENTIAL      Component Value Range   WBC 4.9  4.0 - 10.5 K/uL   RBC 5.37  4.22 - 5.81 MIL/uL   Hemoglobin 14.6  13.0 - 17.0 g/dL   HCT 42.5  39.0 - 52.0 %   MCV 79.1  78.0 - 100.0 fL   MCH 27.2  26.0 - 34.0 pg   MCHC 34.4  30.0 - 36.0 g/dL   RDW 16.1  09.6 - 04.5 %   Platelets 218  150 - 400 K/uL   Neutrophils Relative 66  43 - 77 %   Neutro Abs 3.2  1.7 - 7.7 K/uL   Lymphocytes Relative 24  12 - 46 %   Lymphs Abs 1.2  0.7 - 4.0 K/uL   Monocytes Relative 6  3 - 12 %   Monocytes Absolute 0.3  0.1 - 1.0 K/uL   Eosinophils Relative 4  0 - 5 %   Eosinophils Absolute 0.2  0.0 - 0.7 K/uL   Basophils Relative 0  0 - 1 %   Basophils Absolute 0.0  0.0 - 0.1 K/uL  COMPREHENSIVE METABOLIC PANEL      Component Value Range   Sodium 136  135 - 145 mEq/L   Potassium 4.1  3.5 - 5.1 mEq/L   Chloride  102  96 - 112 mEq/L   CO2 24  19 - 32 mEq/L   Glucose, Bld 92  70 - 99 mg/dL   BUN 16  6 - 23 mg/dL   Creatinine, Ser 4.09  0.50 - 1.35 mg/dL   Calcium 9.4  8.4 - 81.1 mg/dL   Total Protein 8.0  6.0 - 8.3 g/dL   Albumin 4.0  3.5 - 5.2 g/dL   AST 29  0 - 37 U/L   ALT 56 (*) 0 - 53 U/L   Alkaline Phosphatase 95  39 - 117 U/L   Total Bilirubin 0.2 (*) 0.3 - 1.2 mg/dL   GFR calc non Af Amer >90  >90 mL/min   GFR calc Af Amer >90  >90 mL/min  URINE RAPID DRUG SCREEN (HOSP PERFORMED)      Component Value Range   Opiates NONE DETECTED  NONE DETECTED   Cocaine NONE DETECTED  NONE DETECTED   Benzodiazepines NONE DETECTED  NONE DETECTED   Amphetamines NONE DETECTED  NONE DETECTED   Tetrahydrocannabinol POSITIVE (*) NONE DETECTED   Barbiturates NONE DETECTED  NONE DETECTED  URINALYSIS, ROUTINE W REFLEX MICROSCOPIC      Component Value Range   Color, Urine YELLOW  YELLOW   APPearance CLEAR  CLEAR   Specific Gravity, Urine 1.025  1.005 - 1.030   pH 6.0  5.0 - 8.0   Glucose, UA NEGATIVE  NEGATIVE mg/dL   Hgb urine dipstick NEGATIVE  NEGATIVE   Bilirubin Urine NEGATIVE  NEGATIVE   Ketones, ur NEGATIVE  NEGATIVE mg/dL   Protein, ur NEGATIVE  NEGATIVE mg/dL   Urobilinogen, UA 0.2  0.0 - 1.0 mg/dL   Nitrite NEGATIVE  NEGATIVE   Leukocytes, UA NEGATIVE  NEGATIVE  PHENYTOIN LEVEL, TOTAL      Component Value Range   Phenytoin Lvl 3.9 (*) 10.0 - 20.0 ug/mL   No results found.  3:26 PM Dilantin level 3.9. Loaded here with 500mg , will take 300mg  at home in 2 hrs and restart regular dosages tonight. Pt otherwise non toxic, no distress  Filed Vitals:   08/26/12 1254  BP: 138/73  Pulse: 63  Temp: 98.5 F (36.9 C)  Resp: 16    1. Subtherapeutic serum dilantin level   2. Seizure       MDM          Lottie Mussel, PA 08/26/12 1537

## 2012-11-14 ENCOUNTER — Emergency Department (HOSPITAL_COMMUNITY)
Admission: EM | Admit: 2012-11-14 | Discharge: 2012-11-14 | Disposition: A | Discharge status: Home / Self Care | Payer: Self-pay | Attending: Emergency Medicine | Admitting: Emergency Medicine

## 2012-11-14 ENCOUNTER — Encounter (HOSPITAL_COMMUNITY): Payer: Self-pay | Admitting: *Deleted

## 2012-11-14 DIAGNOSIS — Z79899 Other long term (current) drug therapy: Secondary | ICD-10-CM | POA: Insufficient documentation

## 2012-11-14 DIAGNOSIS — F17200 Nicotine dependence, unspecified, uncomplicated: Secondary | ICD-10-CM | POA: Insufficient documentation

## 2012-11-14 DIAGNOSIS — J45909 Unspecified asthma, uncomplicated: Secondary | ICD-10-CM | POA: Insufficient documentation

## 2012-11-14 DIAGNOSIS — G40909 Epilepsy, unspecified, not intractable, without status epilepticus: Secondary | ICD-10-CM | POA: Insufficient documentation

## 2012-11-14 DIAGNOSIS — R748 Abnormal levels of other serum enzymes: Secondary | ICD-10-CM | POA: Insufficient documentation

## 2012-11-14 DIAGNOSIS — R7889 Finding of other specified substances, not normally found in blood: Secondary | ICD-10-CM

## 2012-11-14 DIAGNOSIS — Z87828 Personal history of other (healed) physical injury and trauma: Secondary | ICD-10-CM | POA: Insufficient documentation

## 2012-11-14 DIAGNOSIS — R569 Unspecified convulsions: Secondary | ICD-10-CM

## 2012-11-14 LAB — POCT I-STAT, CHEM 8
HCT: 48 % (ref 39.0–52.0)
Hemoglobin: 16.3 g/dL (ref 13.0–17.0)
Sodium: 140 mEq/L (ref 135–145)
TCO2: 19 mmol/L (ref 0–100)

## 2012-11-14 LAB — GLUCOSE, CAPILLARY: Glucose-Capillary: 130 mg/dL — ABNORMAL HIGH (ref 70–99)

## 2012-11-14 LAB — PHENYTOIN LEVEL, TOTAL: Phenytoin Lvl: 5.6 ug/mL — ABNORMAL LOW (ref 10.0–20.0)

## 2012-11-14 MED ORDER — SODIUM CHLORIDE 0.9 % IV SOLN
1000.0000 mg | Freq: Once | INTRAVENOUS | Status: AC
  Administered 2012-11-14: 1000 mg via INTRAVENOUS
  Filled 2012-11-14: qty 20

## 2012-11-14 MED ORDER — ACETAMINOPHEN 325 MG PO TABS
650.0000 mg | ORAL_TABLET | Freq: Once | ORAL | Status: AC
  Administered 2012-11-14: 650 mg via ORAL
  Filled 2012-11-14: qty 2

## 2012-11-14 MED ORDER — PHENYTOIN SODIUM EXTENDED 100 MG PO CAPS: 300.0000 mg | ORAL_CAPSULE | Freq: Every day | ORAL | Status: DC

## 2012-11-14 NOTE — ED Provider Notes (Signed)
History     CSN: [id number]  Arrival date & time 11/14/12  0443   First MD Initiated Contact with Patient 11/14/12 0459      No chief complaint on file.   (Consider location/radiation/quality/duration/timing/severity/associated sxs/prior treatment) HPI 22 year old male with history of seizures who presents after having 4 seizures at home. Patient was postictal, did bite his tongue. Patient is still confused. Wife denies missing any doses of Dilantin. Last seizure about 3 months ago. No recent illnesses, patient has been sleeping and eating well.  Past Medical History  Diagnosis Date  . Seizures   . Asthma   . Facial trauma 04/2008    Past Surgical History  Procedure Date  . No past surgeries     No family history on file.  History  Substance Use Topics  . Smoking status: Current Every Day Smoker -- 0.2 packs/day for 10 years  . Smokeless tobacco: Never Used  . Alcohol Use: Yes     Comment: occasionally      Review of Systems  Unable to perform ROS: Mental status change    Allergies  Amoxicillin and Zyrtec  Home Medications   Current Outpatient Rx  Name  Route  Sig  Dispense  Refill  . PHENYTOIN SODIUM EXTENDED 100 MG PO CAPS   Oral   Take 3 capsules (300 mg total) by mouth daily.   90 capsule   3     BP 123/78  Pulse 88  Temp 98.3 F (36.8 C)  SpO2 98%  Physical Exam  Nursing note and vitals reviewed. Constitutional: He appears well-developed and well-nourished.  HENT:  Head: Normocephalic and atraumatic.  Right Ear: External ear normal.  Left Ear: External ear normal.  Nose: Nose normal.  Mouth/Throat: Oropharynx is clear and moist.       Patient with bite marks to the right lateral aspect of his tongue. These are superficial and do not require repair  Eyes: Conjunctivae normal and EOM are normal. Pupils are equal, round, and reactive to light.  Neck: Normal range of motion. Neck supple. No JVD present. No tracheal deviation present. No  thyromegaly present.  Cardiovascular: Normal rate, regular rhythm, normal heart sounds and intact distal pulses.  Exam reveals no gallop and no friction rub.   No murmur heard. Pulmonary/Chest: Effort normal and breath sounds normal. No stridor. No respiratory distress. He has no wheezes. He has no rales. He exhibits no tenderness.  Abdominal: Soft. Bowel sounds are normal. He exhibits no distension and no mass. There is no tenderness. There is no rebound and no guarding.  Musculoskeletal: Normal range of motion. He exhibits no edema and no tenderness.  Lymphadenopathy:    He has no cervical adenopathy.  Neurological: He is alert. He has normal reflexes. No cranial nerve deficit. He exhibits normal muscle tone. Coordination normal.       Oriented to self, confused about where and when he is  Skin: Skin is warm and dry. No rash noted. No erythema. No pallor.  Psychiatric: He has a normal mood and affect. His behavior is normal. Judgment and thought content normal.    ED Course  Procedures (including critical care time)  Labs Reviewed  PHENYTOIN LEVEL, TOTAL - Abnormal; Notable for the following:    Phenytoin Lvl 5.6 (*)     All other components within normal limits  GLUCOSE, CAPILLARY - Abnormal; Notable for the following:    Glucose-Capillary 130 (*)     All other components within normal limits  POCT I-STAT, CHEM 8 - Abnormal; Notable for the following:    Glucose, Bld 102 (*)     Calcium, Ion 1.06 (*)     All other components within normal limits   No results found.   1. Subtherapeutic serum dilantin level   2. Seizure       MDM  22 year old male with seizure. Will check Dilantin level. Patient is otherwise stable, we'll reassess for improvement in mental status as he comes out of his postictal state 7:31 AM Patient is back to his baseline. His midline level was noted to be low. He ran out of his Dilantin last night. We'll refill his Dilantin, patient has IV in place, would  give IV loading dose of Dilantin and discharge home.        Olivia Mackie, MD 11/14/12 [phone]

## 2012-11-14 NOTE — ED Notes (Signed)
JYN:[id number]<AO> Expected date:<BR> Expected time:<BR> Means of arrival:<BR> Comments:<BR> EMS/seizure activity with oral trauma-alert now/vomited x 1-got Zofran

## 2012-11-14 NOTE — ED Notes (Signed)
Per EMS report: pt has had 4 full body seizures lasting about a minute a piece.  Seizures witnessed by pt's girlfriend.  The last seizure cause oral trauma.  Pt does not remember the seizure.  Pt was initially out but then came around after a few minutes.  Pt currently post-ictal.  EMS reports vomiting x 2 enroute to hospital.  Pt given 4mg  Zofran and pt has not vomited since. IV: 20 left hand.  CBG: 140, BP:131/88, HR: 90, RR: 16, O2: 100%

## 2012-12-16 ENCOUNTER — Encounter (HOSPITAL_COMMUNITY): Payer: Self-pay | Admitting: *Deleted

## 2012-12-16 ENCOUNTER — Emergency Department (HOSPITAL_COMMUNITY)
Admission: EM | Admit: 2012-12-16 | Discharge: 2012-12-16 | Discharge status: Against Medical Advice | Payer: Self-pay | Attending: Emergency Medicine | Admitting: Emergency Medicine

## 2012-12-16 DIAGNOSIS — F17200 Nicotine dependence, unspecified, uncomplicated: Secondary | ICD-10-CM | POA: Insufficient documentation

## 2012-12-16 DIAGNOSIS — G40909 Epilepsy, unspecified, not intractable, without status epilepticus: Secondary | ICD-10-CM | POA: Insufficient documentation

## 2012-12-16 DIAGNOSIS — Z87828 Personal history of other (healed) physical injury and trauma: Secondary | ICD-10-CM | POA: Insufficient documentation

## 2012-12-16 DIAGNOSIS — J45909 Unspecified asthma, uncomplicated: Secondary | ICD-10-CM | POA: Insufficient documentation

## 2012-12-16 DIAGNOSIS — Z79899 Other long term (current) drug therapy: Secondary | ICD-10-CM | POA: Insufficient documentation

## 2012-12-16 NOTE — ED Notes (Addendum)
Per ems pt hx of seizures. pts gf called and reported pt had a seizure that lasted about 2 min. When ems arrived pt was postictal, laying in bed, only responded to pain, diaphoretic, normal respirations, no trauma to tongue/ blood in mouth noted. In route to ED pt was very sleepy but would pull away and groan when ems tried to assess. Pt would respond by closing his eyes and going back to sleep. EMS unable to get an IV because pt pulled away. Pt refused to sign for EMS.   Upon arrival to ED pt is awake and alert, pt will not tell rn his name or birthdate, reports he does not want to be in ED. Requested phone to call his ride. Pt will pull away from rn with fist clenched. Pt verbally aggressive and rude. Pt non cooperative.

## 2012-12-16 NOTE — ED Notes (Signed)
Pt left the room, room is empty.

## 2012-12-16 NOTE — ED Notes (Signed)
Tech tried to get pt into gown and pt reports that he is leaving AMA and his ride is coming to get him

## 2012-12-16 NOTE — ED Notes (Addendum)
Pt reported to tech that he wanted to leave AMA. md came in and assessed pt. Pt now is stating he will stay, or is considering staying.

## 2012-12-16 NOTE — ED Notes (Signed)
AOZ:[id number]<VH> Expected date:<BR> Expected time:<BR> Means of arrival:<BR> Comments:<BR> Ems/ seizure

## 2012-12-16 NOTE — ED Provider Notes (Signed)
History     CSN: [id number]  Arrival date & time 12/16/12  4098   First MD Initiated Contact with Patient 12/16/12 [phone]      Chief Complaint  Patient presents with  . Seizures    (Consider location/radiation/quality/duration/timing/severity/associated sxs/prior treatment) Patient is a 22 y.o. male presenting with seizures. The history is provided by the patient.  Seizures pt with hx sz disorder, presents via ems after witnessed seizure at home, while laying in bed, lasting 1-2 minutes, generalized tc sz activity noted. Was briefly postictal/confused, mental status clear on arrival to ed.  Pt denies other recent sz or recent abrupt increase in sz frequency. States had recently missed a dose or two of his dilantin, but states generally compliant w rx. States he has plenty of his dilantin at home. Denies any recent change in meds or new meds. Denies drug use x occasional thc use. No recent mva, trauma or fall. States recent health at baseline. No recent febrile illness. No nvd. No headaches. No gu c/o. Denies any pain. No neck or back pain. No numbness/weakness.    Past Medical History  Diagnosis Date  . Seizures   . Asthma   . Facial trauma 04/2008    Past Surgical History  Procedure Laterality Date  . No past surgeries      No family history on file.  History  Substance Use Topics  . Smoking status: Current Every Day Smoker -- 0.25 packs/day for 10 years  . Smokeless tobacco: Never Used  . Alcohol Use: Yes     Comment: occasionally      Review of Systems  Constitutional: Negative for fever and chills.  HENT: Negative for neck pain and neck stiffness.   Eyes: Negative for pain and visual disturbance.  Respiratory: Negative for cough and shortness of breath.   Cardiovascular: Negative for chest pain.  Gastrointestinal: Negative for abdominal pain.  Genitourinary: Negative for dysuria and flank pain.  Musculoskeletal: Negative for back pain.  Skin: Negative for rash.   Neurological: Positive for seizures. Negative for headaches.  Hematological: Does not bruise/bleed easily.  Psychiatric/Behavioral: The patient is not nervous/anxious.     Allergies  Amoxicillin and Zyrtec  Home Medications   Current Outpatient Rx  Name  Route  Sig  Dispense  Refill  . phenytoin (DILANTIN) 100 MG ER capsule   Oral   Take 3 capsules (300 mg total) by mouth daily.   90 capsule   3     BP 115/68  Pulse 79  Temp(Src) 97.5 F (36.4 C)  Resp 16  SpO2 95%  Physical Exam  Nursing note and vitals reviewed. Constitutional: He is oriented to person, place, and time. He appears well-developed and well-nourished. No distress.  HENT:  Head: Atraumatic.  Nose: Nose normal.  Mouth/Throat: Oropharynx is clear and moist.  No oral/tongue trauma.  Eyes: Conjunctivae are normal. Pupils are equal, round, and reactive to light. No scleral icterus.  Neck: Neck supple. No tracheal deviation present.  Cardiovascular: Normal rate, regular rhythm, normal heart sounds and intact distal pulses.   Pulmonary/Chest: Effort normal and breath sounds normal. No accessory muscle usage. No respiratory distress. He exhibits no tenderness.  Abdominal: Soft. He exhibits no distension. There is no tenderness.  Genitourinary:  No cva tenderness  Musculoskeletal: Normal range of motion. He exhibits no edema and no tenderness.  CTLS spine, non tender, aligned, no step off.   Neurological: He is alert and oriented to person, place, and time.  Motor intact  bil.   Skin: Skin is warm and dry.  Psychiatric: He has a normal mood and affect.    ED Course  Procedures (including critical care time)     MDM  Iv ns. Seizure precautions.   Reviewed nursing notes and prior charts for additional history.   Labs.  Pt left AMA without notifying myself prior to completion of his evaluation and treatment.         Suzi Roots, MD 12/17/12 [phone]

## 2013-01-19 ENCOUNTER — Encounter (HOSPITAL_COMMUNITY): Payer: Self-pay | Admitting: Emergency Medicine

## 2013-01-19 ENCOUNTER — Emergency Department (HOSPITAL_COMMUNITY)
Admission: EM | Admit: 2013-01-19 | Discharge: 2013-01-19 | Disposition: A | Discharge status: Home / Self Care | Payer: Self-pay | Attending: Emergency Medicine | Admitting: Emergency Medicine

## 2013-01-19 DIAGNOSIS — J45909 Unspecified asthma, uncomplicated: Secondary | ICD-10-CM | POA: Insufficient documentation

## 2013-01-19 DIAGNOSIS — F1910 Other psychoactive substance abuse, uncomplicated: Secondary | ICD-10-CM | POA: Insufficient documentation

## 2013-01-19 DIAGNOSIS — Z760 Encounter for issue of repeat prescription: Secondary | ICD-10-CM

## 2013-01-19 DIAGNOSIS — R51 Headache: Secondary | ICD-10-CM | POA: Insufficient documentation

## 2013-01-19 DIAGNOSIS — F17200 Nicotine dependence, unspecified, uncomplicated: Secondary | ICD-10-CM | POA: Insufficient documentation

## 2013-01-19 DIAGNOSIS — G40909 Epilepsy, unspecified, not intractable, without status epilepticus: Secondary | ICD-10-CM | POA: Insufficient documentation

## 2013-01-19 DIAGNOSIS — Z79899 Other long term (current) drug therapy: Secondary | ICD-10-CM | POA: Insufficient documentation

## 2013-01-19 MED ORDER — PHENYTOIN SODIUM EXTENDED 300 MG PO CAPS: 300.0000 mg | ORAL_CAPSULE | Freq: Every day | ORAL | Status: DC

## 2013-01-19 MED ORDER — IBUPROFEN 200 MG PO TABS
600.0000 mg | ORAL_TABLET | Freq: Once | ORAL | Status: AC
  Administered 2013-01-19: 600 mg via ORAL
  Filled 2013-01-19: qty 3

## 2013-01-19 MED ORDER — PHENYTOIN SODIUM EXTENDED 100 MG PO CAPS
300.0000 mg | ORAL_CAPSULE | Freq: Once | ORAL | Status: AC
Start: 2013-01-19 — End: 2013-01-19
  Administered 2013-01-19: 300 mg via ORAL
  Filled 2013-01-19: qty 3

## 2013-01-19 MED ORDER — PHENYTOIN SODIUM EXTENDED 100 MG PO CAPS
300.0000 mg | ORAL_CAPSULE | Freq: Once | ORAL | Status: AC
  Administered 2013-01-19: 300 mg via ORAL
  Filled 2013-01-19: qty 3

## 2013-01-19 NOTE — ED Provider Notes (Signed)
History    21yM with HA. Onset 2d. L temporal region. Intermittent. Pt says gets HA like this often before he has a seizure. Hx of seizure disorder on phenytoin. Last dose 5 days ago. Says ran out. No PCP or neurologist. Previous prescriptions from ED/hospital. Last several about 1 month ago. No fever. No trauma. No numbness, tingling, loss of strength or visual changes.   CSN: [id number]  Arrival date & time 01/19/13  1521   First MD Initiated Contact with Patient 01/19/13 1552      Chief Complaint  Patient presents with  . Headache    (Consider location/radiation/quality/duration/timing/severity/associated sxs/prior treatment) HPI  Past Medical History  Diagnosis Date  . Seizures   . Asthma   . Facial trauma 04/2008    Past Surgical History  Procedure Laterality Date  . No past surgeries      No family history on file.  History  Substance Use Topics  . Smoking status: Current Every Day Smoker -- 0.25 packs/day for 10 years  . Smokeless tobacco: Never Used  . Alcohol Use: Yes     Comment: occasionally      Review of Systems  All systems reviewed and negative, other than as noted in HPI.   Allergies  Amoxicillin and Zyrtec  Home Medications   Current Outpatient Rx  Name  Route  Sig  Dispense  Refill  . phenytoin (DILANTIN) 100 MG ER capsule   Oral   Take 3 capsules (300 mg total) by mouth daily.   90 capsule   3     BP 122/64  Pulse 69  Temp(Src) 97.9 F (36.6 C) (Oral)  Resp 16  SpO2 97%  Physical Exam  Nursing note and vitals reviewed. Constitutional: He appears well-developed and well-nourished. No distress.  HENT:  Head: Normocephalic and atraumatic.  Eyes: Conjunctivae and EOM are normal. Pupils are equal, round, and reactive to light. Right eye exhibits no discharge. Left eye exhibits no discharge.  Neck: Neck supple.  No nuchal rigidity  Cardiovascular: Normal rate, regular rhythm and normal heart sounds.  Exam reveals no gallop and no  friction rub.   No murmur heard. Pulmonary/Chest: Effort normal and breath sounds normal. No respiratory distress.  Abdominal: Soft. He exhibits no distension. There is no tenderness.  Musculoskeletal: He exhibits no edema and no tenderness.  Neurological: He is alert. No cranial nerve deficit. He exhibits normal muscle tone. Coordination normal.  Good finger to nose b/l. Gait steady.   Skin: Skin is warm. He is diaphoretic.  Psychiatric: He has a normal mood and affect. His behavior is normal. Thought content normal.    ED Course  Procedures (including critical care time)  Labs Reviewed - No data to display No results found.   1. Headache   2. Medication refill       MDM  21yM with HA nad med refill. Hx of seizure disorder but no recent seizure activity. Nonfocal neuro exam. No trauma. Very low suspicion for emergent cause of HA. Deferred from checking phenytoin level. Going to be sub therapeutic since hasn't had in days. Additional dose given in ED. Scripts. Resource list.         Raeford Razor, MD 01/19/13 [phone]

## 2013-01-19 NOTE — ED Notes (Signed)
Pt complains of headache x 2 days and "I'm out of my seizure medications"

## 2013-04-20 ENCOUNTER — Emergency Department (HOSPITAL_COMMUNITY): Payer: Self-pay

## 2013-04-20 ENCOUNTER — Emergency Department (HOSPITAL_COMMUNITY)
Admission: EM | Admit: 2013-04-20 | Discharge: 2013-04-20 | Disposition: A | Discharge status: Home / Self Care | Payer: Self-pay | Attending: Emergency Medicine | Admitting: Emergency Medicine

## 2013-04-20 ENCOUNTER — Encounter (HOSPITAL_COMMUNITY): Payer: Self-pay | Admitting: *Deleted

## 2013-04-20 DIAGNOSIS — Z87898 Personal history of other specified conditions: Secondary | ICD-10-CM

## 2013-04-20 DIAGNOSIS — R569 Unspecified convulsions: Secondary | ICD-10-CM | POA: Insufficient documentation

## 2013-04-20 DIAGNOSIS — R404 Transient alteration of awareness: Secondary | ICD-10-CM | POA: Insufficient documentation

## 2013-04-20 DIAGNOSIS — F17200 Nicotine dependence, unspecified, uncomplicated: Secondary | ICD-10-CM | POA: Insufficient documentation

## 2013-04-20 DIAGNOSIS — J45909 Unspecified asthma, uncomplicated: Secondary | ICD-10-CM | POA: Insufficient documentation

## 2013-04-20 DIAGNOSIS — Z888 Allergy status to other drugs, medicaments and biological substances status: Secondary | ICD-10-CM | POA: Insufficient documentation

## 2013-04-20 DIAGNOSIS — Z79899 Other long term (current) drug therapy: Secondary | ICD-10-CM | POA: Insufficient documentation

## 2013-04-20 DIAGNOSIS — K120 Recurrent oral aphthae: Secondary | ICD-10-CM | POA: Insufficient documentation

## 2013-04-20 DIAGNOSIS — Z881 Allergy status to other antibiotic agents status: Secondary | ICD-10-CM | POA: Insufficient documentation

## 2013-04-20 DIAGNOSIS — R110 Nausea: Secondary | ICD-10-CM | POA: Insufficient documentation

## 2013-04-20 LAB — CBC
HCT: 47.3 % (ref 39.0–52.0)
Hemoglobin: 16.3 g/dL (ref 13.0–17.0)
MCH: 27.4 pg (ref 26.0–34.0)
MCV: 79.6 fL (ref 78.0–100.0)
RBC: 5.94 MIL/uL — ABNORMAL HIGH (ref 4.22–5.81)

## 2013-04-20 LAB — BASIC METABOLIC PANEL
CO2: 22 mEq/L (ref 19–32)
Calcium: 9.6 mg/dL (ref 8.4–10.5)
Creatinine, Ser: 0.98 mg/dL (ref 0.50–1.35)
Glucose, Bld: 83 mg/dL (ref 70–99)

## 2013-04-20 MED ORDER — ONDANSETRON HCL 4 MG/2ML IJ SOLN: 4.0000 mg | INTRAMUSCULAR | Status: DC

## 2013-04-20 MED ORDER — ONDANSETRON 4 MG PO TBDP
ORAL_TABLET | ORAL | Status: AC
  Filled 2013-04-20: qty 2

## 2013-04-20 MED ORDER — PHENYTOIN SODIUM EXTENDED 100 MG PO CAPS: 300.0000 mg | ORAL_CAPSULE | Freq: Once | ORAL | Status: DC

## 2013-04-20 MED ORDER — ONDANSETRON 4 MG PO TBDP
8.0000 mg | ORAL_TABLET | ORAL | Status: AC
  Administered 2013-04-20: 8 mg via ORAL

## 2013-04-20 MED ORDER — PHENYTOIN SODIUM EXTENDED 100 MG PO CAPS: 300.0000 mg | ORAL_CAPSULE | Freq: Every day | ORAL | Status: DC

## 2013-04-20 MED ORDER — ONDANSETRON HCL 4 MG/2ML IJ SOLN
INTRAMUSCULAR | Status: AC
  Filled 2013-04-20: qty 2

## 2013-04-20 MED ORDER — ACETAMINOPHEN 325 MG PO TABS
650.0000 mg | ORAL_TABLET | ORAL | Status: AC
  Administered 2013-04-20: 650 mg via ORAL
  Filled 2013-04-20: qty 2

## 2013-04-20 MED ORDER — SODIUM CHLORIDE 0.9 % IV SOLN
300.0000 mg | Freq: Once | INTRAVENOUS | Status: AC
  Administered 2013-04-20: 300 mg via INTRAVENOUS
  Filled 2013-04-20: qty 6

## 2013-04-20 NOTE — ED Notes (Signed)
Patient transported to CT 

## 2013-04-20 NOTE — ED Notes (Signed)
IV attempted x2, second RN to bedside to attempt to obtain IV.

## 2013-04-20 NOTE — ED Notes (Signed)
Pt undressed, in gown, on monitor, continuous pulse oximetry and blood pressure cuff; seizure pads on bedrails; family at bedside

## 2013-04-20 NOTE — ED Provider Notes (Signed)
History    CSN: [id number] Arrival date & time 04/20/13  1255  First MD Initiated Contact with Patient 04/20/13 1313     Chief Complaint  Patient presents with  . Seizures   (Consider location/radiation/quality/duration/timing/severity/associated sxs/prior Treatment) Patient is a 22 y.o. male presenting with seizures. The history is provided by the patient. No language interpreter was used.  Seizures Seizure activity on arrival: no   Context comment:  Seizure today x 1 in a patient with seizure history. He reports taking his medication as usual.  No recent illness, loss of sleep, or missed doses Dilantin.  Past Medical History  Diagnosis Date  . Seizures   . Asthma   . Facial trauma 04/2008   Past Surgical History  Procedure Laterality Date  . No past surgeries     No family history on file. History  Substance Use Topics  . Smoking status: Current Every Day Smoker -- 0.25 packs/day for 10 years  . Smokeless tobacco: Never Used  . Alcohol Use: Yes     Comment: occasionally    Review of Systems  Constitutional: Negative for fever and chills.  HENT: Positive for mouth sores. Negative for neck pain.   Respiratory: Negative.  Negative for shortness of breath.   Cardiovascular: Negative.  Negative for chest pain.  Gastrointestinal: Positive for nausea.  Genitourinary: Negative for dysuria.  Musculoskeletal: Negative.   Skin: Negative.   Neurological: Positive for seizures.    Allergies  Amoxicillin and Zyrtec  Home Medications   Current Outpatient Rx  Name  Route  Sig  Dispense  Refill  . phenytoin (DILANTIN) 100 MG ER capsule   Oral   Take 300 mg by mouth daily.          BP 121/73  Pulse 52  Temp(Src) 97.7 F (36.5 C) (Oral)  Resp 14  SpO2 100% Physical Exam  Constitutional: He is oriented to person, place, and time. He appears well-developed and well-nourished.  HENT:  Head: Normocephalic and atraumatic.  Tongue contusion without laceration.  Eyes:  EOM are normal. Pupils are equal, round, and reactive to light.  Neck: Normal range of motion.  Cardiovascular: Normal rate and regular rhythm.   No murmur heard. Pulmonary/Chest: Effort normal and breath sounds normal. He has no wheezes. He has no rales.  Abdominal: Soft. There is no tenderness.  Musculoskeletal: Normal range of motion.  Neurological: He is alert and oriented to person, place, and time. He has normal strength and normal reflexes. No sensory deficit. He displays a negative Romberg sign.  Cranial nerves 3-12 grossly intact.   Skin: Skin is warm and dry.  Psychiatric: He has a normal mood and affect.    ED Course  Procedures (including critical care time) Labs Reviewed  PHENYTOIN LEVEL, TOTAL  CBC  BASIC METABOLIC PANEL   Results for orders placed during the hospital encounter of 04/20/13  PHENYTOIN LEVEL, TOTAL      Result Value Range   Phenytoin Lvl 9.6 (*) 10.0 - 20.0 ug/mL  CBC      Result Value Range   WBC 6.0  4.0 - 10.5 K/uL   RBC 5.94 (*) 4.22 - 5.81 MIL/uL   Hemoglobin 16.3  13.0 - 17.0 g/dL   HCT 78.2  95.6 - 21.3 %   MCV 79.6  78.0 - 100.0 fL   MCH 27.4  26.0 - 34.0 pg   MCHC 34.5  30.0 - 36.0 g/dL   RDW 08.6  57.8 - 46.9 %  Platelets 227  150 - 400 K/uL  BASIC METABOLIC PANEL      Result Value Range   Sodium 138  135 - 145 mEq/L   Potassium 3.9  3.5 - 5.1 mEq/L   Chloride 102  96 - 112 mEq/L   CO2 22  19 - 32 mEq/L   Glucose, Bld 83  70 - 99 mg/dL   BUN 10  6 - 23 mg/dL   Creatinine, Ser 1.61  0.50 - 1.35 mg/dL   Calcium 9.6  8.4 - 09.6 mg/dL   GFR calc non Af Amer >90  >90 mL/min   GFR calc Af Amer >90  >90 mL/min    No results found. No diagnosis found. 1. Seizure 2. History of seizure  MDM  He is alert and oriented here. Dilantin level just below normal. Discussed with pharmacy and will load orally with 300 mg. Refil Rx given and he is encouraged to find a PCP.  3:40 - On re-eval, patient is difficult to waken, less responsive,  non-verbal when awaken by painful stimuli. Question altered mental status vs repeat, unwitnessed seizure vs head injury during known seizure. Will order head CT, give Dilantin 300 mg IV instead of PO and continue to monitor. Patient  Care transferred to Dr. Jeraldine Loots.  Arnoldo Hooker, PA-C 04/20/13 1527  Arnoldo Hooker, PA-C 04/20/13 1549

## 2013-04-20 NOTE — ED Notes (Signed)
Waiting on IV Team for restart of PIV for before Dilantin can be administered.

## 2013-04-20 NOTE — ED Notes (Signed)
Went to pt room with [place], [place] and found pt difficult to arouse. Administered ammonia to wake up pt for discharge. Pt still drowsy. Discharge on hold secondary to pt still post ictal.

## 2013-04-20 NOTE — ED Notes (Signed)
Pt is resting at this time

## 2013-04-20 NOTE — ED Notes (Signed)
Pt is here with weakness and decreased appetite.  Pt has had 2 seizures today and takes phenytoin.  Pt has abrasion above right eye and has bite mark to right tongue,  No incontinence

## 2013-04-21 NOTE — ED Provider Notes (Signed)
Medical screening examination/treatment/procedure(s) were performed by non-physician practitioner and as supervising physician I was immediately available for consultation/collaboration.  Elivia Robotham T Zanya Lindo, MD 04/21/13 1512 

## 2013-05-12 ENCOUNTER — Emergency Department (HOSPITAL_COMMUNITY)
Admission: EM | Admit: 2013-05-12 | Discharge: 2013-05-12 | Disposition: A | Discharge status: Home / Self Care | Payer: Self-pay | Attending: Emergency Medicine | Admitting: Emergency Medicine

## 2013-05-12 ENCOUNTER — Encounter (HOSPITAL_COMMUNITY): Payer: Self-pay | Admitting: Emergency Medicine

## 2013-05-12 DIAGNOSIS — F17200 Nicotine dependence, unspecified, uncomplicated: Secondary | ICD-10-CM | POA: Insufficient documentation

## 2013-05-12 DIAGNOSIS — Z87828 Personal history of other (healed) physical injury and trauma: Secondary | ICD-10-CM | POA: Insufficient documentation

## 2013-05-12 DIAGNOSIS — R569 Unspecified convulsions: Secondary | ICD-10-CM | POA: Insufficient documentation

## 2013-05-12 DIAGNOSIS — Z79899 Other long term (current) drug therapy: Secondary | ICD-10-CM | POA: Insufficient documentation

## 2013-05-12 DIAGNOSIS — J45909 Unspecified asthma, uncomplicated: Secondary | ICD-10-CM | POA: Insufficient documentation

## 2013-05-12 DIAGNOSIS — Z113 Encounter for screening for infections with a predominantly sexual mode of transmission: Secondary | ICD-10-CM | POA: Insufficient documentation

## 2013-05-12 NOTE — ED Notes (Signed)
Pt here requesting STD check; pt denies sx at present

## 2013-05-12 NOTE — ED Provider Notes (Signed)
CSN: [id number]     Arrival date & time 05/12/13  1434 History    This chart was scribed for non-physician practitioner working with Gilda Crease, * by Leone Payor, ED Scribe. This patient was seen in room TR07C/TR07C and the patient's care was started at 1434.   First MD Initiated Contact with Patient 05/12/13 1525     Chief Complaint  Patient presents with  . SEXUALLY TRANSMITTED DISEASE   The history is provided by the patient. No language interpreter was used.    HPI Comments: Kyle Adams is a 22 y.o. male who presents to the Emergency Department requesting an STD check. States he does not have any symptoms but is here for a routine check. He denies dysuria, penile discharge, genital sores.   Pt is a current everyday smoker and occasional alcohol user.   Past Medical History  Diagnosis Date  . Seizures   . Asthma   . Facial trauma 04/2008   Past Surgical History  Procedure Laterality Date  . No past surgeries     History reviewed. No pertinent family history. History  Substance Use Topics  . Smoking status: Current Every Day Smoker -- 0.25 packs/day for 10 years  . Smokeless tobacco: Never Used  . Alcohol Use: Yes     Comment: occasionally    Review of Systems  Genitourinary: Negative for dysuria, discharge and genital sores.  All other systems reviewed and are negative.    Allergies  Amoxicillin and Zyrtec  Home Medications   Current Outpatient Rx  Name  Route  Sig  Dispense  Refill  . diphenhydrAMINE (SOMINEX) 25 MG tablet   Oral   Take 50 mg by mouth at bedtime as needed for allergies.          . phenytoin (DILANTIN) 100 MG ER capsule   Oral   Take 300 mg by mouth daily.          BP 109/63  Pulse 94  Temp(Src) 98.3 F (36.8 C) (Oral)  Resp 18  SpO2 97% Physical Exam  Nursing note and vitals reviewed. Constitutional: He is oriented to person, place, and time. He appears well-developed.  HENT:  Head: Normocephalic.  Eyes:  Conjunctivae are normal.  Neck: No tracheal deviation present.  Cardiovascular:  No murmur heard. Genitourinary: Testes normal and penis normal.  Musculoskeletal: Normal range of motion.  Neurological: He is oriented to person, place, and time.  Skin: Skin is warm.  Psychiatric: He has a normal mood and affect.    ED Course   Procedures (including critical care time)  DIAGNOSTIC STUDIES: Oxygen Saturation is 97% on RA, adequate by my interpretation.    COORDINATION OF CARE: 3:45 PM Discussed treatment plan with pt at bedside and pt agreed to plan.   Labs Reviewed - No data to display No results found. 1. Screen for STD (sexually transmitted disease)     MDM  GC swabs taken. Pt referred and given info the Health Department.  21 y.o.Kyle Adams evaluation in the Emergency Department is complete. It has been determined that no acute conditions requiring further emergency intervention are present at this time. The patient/guardian have been advised of the diagnosis and plan. We have discussed signs and symptoms that warrant return to the ED, such as changes or worsening in symptoms.  Vital signs are stable at discharge. Filed Vitals:   05/12/13 1445  BP: 109/63  Pulse: 94  Temp: 98.3 F (36.8 C)  Resp: 18  Patient/guardian has voiced understanding and agreed to follow-up with the PCP or specialist.  I personally performed the services described in this documentation, which was scribed in my presence. The recorded information has been reviewed and is accurate.  Dorthula Matas, PA-C 05/12/13 1553  Dorthula Matas, PA-C 05/12/13 1554

## 2013-05-13 LAB — GC/CHLAMYDIA PROBE AMP: GC Probe RNA: NEGATIVE

## 2013-05-13 NOTE — ED Provider Notes (Signed)
Medical screening examination/treatment/procedure(s) were performed by non-physician practitioner and as supervising physician I was immediately available for consultation/collaboration.   Christopher J. Pollina, MD 05/13/13 1634 

## 2013-07-22 ENCOUNTER — Emergency Department (HOSPITAL_COMMUNITY)
Admission: EM | Admit: 2013-07-22 | Discharge: 2013-07-22 | Disposition: A | Discharge status: Home / Self Care | Payer: Medicaid Other | Attending: Emergency Medicine | Admitting: Emergency Medicine

## 2013-07-22 ENCOUNTER — Encounter (HOSPITAL_COMMUNITY): Payer: Self-pay | Admitting: Emergency Medicine

## 2013-07-22 DIAGNOSIS — Z79899 Other long term (current) drug therapy: Secondary | ICD-10-CM | POA: Insufficient documentation

## 2013-07-22 DIAGNOSIS — F17200 Nicotine dependence, unspecified, uncomplicated: Secondary | ICD-10-CM | POA: Insufficient documentation

## 2013-07-22 DIAGNOSIS — Z880 Allergy status to penicillin: Secondary | ICD-10-CM | POA: Insufficient documentation

## 2013-07-22 DIAGNOSIS — Z87828 Personal history of other (healed) physical injury and trauma: Secondary | ICD-10-CM | POA: Insufficient documentation

## 2013-07-22 DIAGNOSIS — G40909 Epilepsy, unspecified, not intractable, without status epilepticus: Secondary | ICD-10-CM | POA: Insufficient documentation

## 2013-07-22 DIAGNOSIS — J45909 Unspecified asthma, uncomplicated: Secondary | ICD-10-CM | POA: Insufficient documentation

## 2013-07-22 DIAGNOSIS — Z760 Encounter for issue of repeat prescription: Secondary | ICD-10-CM

## 2013-07-22 MED ORDER — PHENYTOIN SODIUM EXTENDED 100 MG PO CAPS
300.0000 mg | ORAL_CAPSULE | Freq: Once | ORAL | Status: AC
  Administered 2013-07-22: 300 mg via ORAL
  Filled 2013-07-22: qty 3

## 2013-07-22 MED ORDER — PHENYTOIN SODIUM EXTENDED 100 MG PO CAPS: 300.0000 mg | ORAL_CAPSULE | Freq: Every day | ORAL | Status: DC

## 2013-07-22 NOTE — ED Notes (Signed)
Pt took regular Dilantin dose last night, but it was his last dose.  Pt does not have a PCP.  Pt presented to [facility] for Rx for Dilantin and was referred here.  Pt alert and oriented and in NAD.  Pt states he takes his Dilantin each night at bedtime.

## 2013-07-22 NOTE — ED Notes (Signed)
Pt sent here by [facility] for dilantin level since losing PCP so can get dilantin rx; pt sts taking per norm but ran out last night; pt sts last seizure 1.5 months ago

## 2013-07-22 NOTE — ED Provider Notes (Addendum)
CSN: [id number]     Arrival date & time 07/22/13  0930 History   First MD Initiated Contact with Patient 07/22/13 [phone]     Chief Complaint  Patient presents with  . Labs Only    HPI  .He is long history of seizures. His last seizure was almost a month ago. He states that his Medicaid "ran out" presented to refill his Dilantin he has not missed any doses. His last dose was last night. He takes his dosage at night. He requests a level and a refill.  Past Medical History  Diagnosis Date  . Seizures   . Asthma   . Facial trauma 04/2008   Past Surgical History  Procedure Laterality Date  . No past surgeries     History reviewed. No pertinent family history. History  Substance Use Topics  . Smoking status: Current Every Day Smoker -- 0.25 packs/day for 10 years  . Smokeless tobacco: Never Used  . Alcohol Use: Yes     Comment: occasionally    Review of Systems  Constitutional: Negative for fever, chills, diaphoresis, appetite change and fatigue.  HENT: Negative for mouth sores, sore throat and trouble swallowing.   Eyes: Negative for visual disturbance.  Respiratory: Negative for cough, chest tightness, shortness of breath and wheezing.   Cardiovascular: Negative for chest pain.  Gastrointestinal: Negative for nausea, vomiting, abdominal pain, diarrhea and abdominal distention.  Endocrine: Negative for polydipsia, polyphagia and polyuria.  Genitourinary: Negative for dysuria, frequency and hematuria.  Musculoskeletal: Negative for gait problem.  Skin: Negative for color change, pallor and rash.  Neurological: Negative for dizziness, syncope, light-headedness and headaches.  Hematological: Does not bruise/bleed easily.  Psychiatric/Behavioral: Negative for behavioral problems and confusion.    Allergies  Amoxicillin and Zyrtec  Home Medications   Current Outpatient Rx  Name  Route  Sig  Dispense  Refill  . phenytoin (DILANTIN) 100 MG ER capsule   Oral   Take 300 mg by  mouth daily.         . phenytoin (DILANTIN) 100 MG ER capsule   Oral   Take 3 capsules (300 mg total) by mouth daily.   60 capsule   0    BP 115/67  Pulse 72  Temp(Src) 98.4 F (36.9 C) (Oral)  Resp 18  Ht 5\' 9"  (1.753 m)  Wt 202 lb 4.8 oz (91.763 kg)  BMI 29.86 kg/m2  SpO2 99% Physical Exam  Constitutional: He is oriented to person, place, and time. He appears well-developed and well-nourished. No distress.  HENT:  Head: Normocephalic.  Eyes: Conjunctivae are normal. Pupils are equal, round, and reactive to light. No scleral icterus.  Neck: Normal range of motion. Neck supple. No thyromegaly present.  Cardiovascular: Normal rate and regular rhythm.  Exam reveals no gallop and no friction rub.   No murmur heard. Pulmonary/Chest: Effort normal and breath sounds normal. No respiratory distress. He has no wheezes. He has no rales.  Abdominal: Soft. Bowel sounds are normal. He exhibits no distension. There is no tenderness. There is no rebound.  Musculoskeletal: Normal range of motion.  Neurological: He is alert and oriented to person, place, and time.  Skin: Skin is warm and dry. No rash noted.  Psychiatric: He has a normal mood and affect. His behavior is normal.    ED Course  Procedures (including critical care time) Labs Review Labs Reviewed  PHENYTOIN LEVEL, TOTAL - Abnormal; Notable for the following:    Phenytoin Lvl 6.7 (*)  All other components within normal limits   Imaging Review No results found.  MDM   1. Medication refill   2. Seizure disorder    Level 6.9  Pt instructed to double his Dilantin dosage for the next 48 hours.  Given am dosage here.  Rx x #60 days    Roney Marion, MD 07/22/13 1110  Roney Marion, MD 07/23/13 [phone]

## 2013-08-12 ENCOUNTER — Encounter (HOSPITAL_COMMUNITY): Payer: Self-pay | Admitting: Emergency Medicine

## 2013-08-12 ENCOUNTER — Emergency Department (HOSPITAL_COMMUNITY)
Admission: EM | Admit: 2013-08-12 | Discharge: 2013-08-12 | Disposition: A | Discharge status: Home / Self Care | Payer: Medicaid Other | Attending: Emergency Medicine | Admitting: Emergency Medicine

## 2013-08-12 DIAGNOSIS — F17200 Nicotine dependence, unspecified, uncomplicated: Secondary | ICD-10-CM | POA: Insufficient documentation

## 2013-08-12 DIAGNOSIS — Z79899 Other long term (current) drug therapy: Secondary | ICD-10-CM | POA: Insufficient documentation

## 2013-08-12 DIAGNOSIS — Z87828 Personal history of other (healed) physical injury and trauma: Secondary | ICD-10-CM | POA: Insufficient documentation

## 2013-08-12 DIAGNOSIS — Z760 Encounter for issue of repeat prescription: Secondary | ICD-10-CM | POA: Insufficient documentation

## 2013-08-12 DIAGNOSIS — G40909 Epilepsy, unspecified, not intractable, without status epilepticus: Secondary | ICD-10-CM | POA: Insufficient documentation

## 2013-08-12 DIAGNOSIS — J45909 Unspecified asthma, uncomplicated: Secondary | ICD-10-CM | POA: Insufficient documentation

## 2013-08-12 MED ORDER — PHENYTOIN SODIUM EXTENDED 100 MG PO CAPS: 300.0000 mg | ORAL_CAPSULE | Freq: Every day | ORAL | Status: DC

## 2013-08-12 NOTE — ED Provider Notes (Signed)
CSN: [id number]     Arrival date & time 08/12/13  8295 History   First MD Initiated Contact with Patient 08/12/13 [phone]     Chief Complaint  Patient presents with  . Medication Refill   (Consider location/radiation/quality/duration/timing/severity/associated sxs/prior Treatment) HPI Comments: Patient presents emergency department with chief complaint of needing his medication refill. He states that he has known seizure disorder. He states that he takes Dilantin 300 mg nightly. He states that his last dose was last night. He was seen approximately 2 weeks ago for the same complaint. He was told to followup with [facility]. He has arranged for a followup appointment, but the appointment is not until December 15. He states that he needs additional seizure medications until his appointment. He denies recent seizure. He states that he feels well otherwise. Denies any other complaints at this time.  The history is provided by the patient. No language interpreter was used.    Past Medical History  Diagnosis Date  . Seizures   . Asthma   . Facial trauma 04/2008   Past Surgical History  Procedure Laterality Date  . No past surgeries     No family history on file. History  Substance Use Topics  . Smoking status: Current Some Day Smoker -- 0.25 packs/day for 10 years  . Smokeless tobacco: Never Used  . Alcohol Use: Yes     Comment: occasionally    Review of Systems  All other systems reviewed and are negative.    Allergies  Amoxicillin and Zyrtec  Home Medications   Current Outpatient Rx  Name  Route  Sig  Dispense  Refill  . diphenhydrAMINE (BENADRYL) 12.5 MG/5ML elixir   Oral   Take 12.5 mg by mouth 4 (four) times daily as needed for allergies.         . phenytoin (DILANTIN) 100 MG ER capsule   Oral   Take 3 capsules (300 mg total) by mouth daily.   60 capsule   0    BP 112/75  Pulse 72  Temp(Src) 98.2 F (36.8 C) (Oral)  Resp 16  SpO2  98% Physical Exam  Nursing note and vitals reviewed. Constitutional: He is oriented to person, place, and time. He appears well-developed and well-nourished.  HENT:  Head: Normocephalic and atraumatic.  Eyes: Conjunctivae and EOM are normal. Pupils are equal, round, and reactive to light.  Neck: Normal range of motion. Neck supple. No JVD present.  Cardiovascular: Normal rate, regular rhythm, normal heart sounds and intact distal pulses.  Exam reveals no gallop and no friction rub.   No murmur heard. Pulmonary/Chest: Effort normal and breath sounds normal. No respiratory distress. He has no wheezes. He has no rales. He exhibits no tenderness.  Abdominal: Soft. He exhibits no distension and no mass. There is no tenderness. There is no rebound and no guarding.  Musculoskeletal: Normal range of motion. He exhibits no edema and no tenderness.  Neurological: He is alert and oriented to person, place, and time.  Skin: Skin is warm and dry.  Psychiatric: He has a normal mood and affect. His behavior is normal. Judgment and thought content normal.    ED Course  Procedures (including critical care time) Labs Review Labs Reviewed - No data to display Imaging Review No results found.  EKG Interpretation   None       MDM   1. Medication refill     Patient with seizure disorder. Requesting medication refill. The patient is reasonable.  The timing of when he was last prescribed his medicine and when he has run out is accurate with the last prescription. Will give a refill that will last until his followup appointment with [facility]. Patient is stable and ready for discharge. He is not in any apparent distress. He denies any other symptoms.    Roxy Horseman, PA-C 08/12/13 1044

## 2013-08-12 NOTE — ED Notes (Signed)
Pt was here 2 weeks ago and was given a prescription for Dilantin and follow up at [facility]. Pt is unable to get an appointment until December and does not have any more Dilantin.

## 2013-08-12 NOTE — ED Provider Notes (Signed)
Medical screening examination/treatment/procedure(s) were performed by non-physician practitioner and as supervising physician I was immediately available for consultation/collaboration.  EKG Interpretation   None         Shelda Jakes, MD 08/12/13 1049

## 2013-09-24 ENCOUNTER — Ambulatory Visit: Payer: Self-pay

## 2013-10-23 ENCOUNTER — Encounter (HOSPITAL_COMMUNITY): Payer: Self-pay | Admitting: Emergency Medicine

## 2013-10-23 ENCOUNTER — Emergency Department (HOSPITAL_COMMUNITY)
Admission: EM | Admit: 2013-10-23 | Discharge: 2013-10-23 | Disposition: A | Discharge status: Home / Self Care | Payer: Medicaid Other | Attending: Emergency Medicine | Admitting: Emergency Medicine

## 2013-10-23 DIAGNOSIS — Z880 Allergy status to penicillin: Secondary | ICD-10-CM | POA: Insufficient documentation

## 2013-10-23 DIAGNOSIS — F17200 Nicotine dependence, unspecified, uncomplicated: Secondary | ICD-10-CM | POA: Insufficient documentation

## 2013-10-23 DIAGNOSIS — Z79899 Other long term (current) drug therapy: Secondary | ICD-10-CM | POA: Insufficient documentation

## 2013-10-23 DIAGNOSIS — J45909 Unspecified asthma, uncomplicated: Secondary | ICD-10-CM | POA: Insufficient documentation

## 2013-10-23 DIAGNOSIS — R799 Abnormal finding of blood chemistry, unspecified: Secondary | ICD-10-CM | POA: Insufficient documentation

## 2013-10-23 DIAGNOSIS — G40909 Epilepsy, unspecified, not intractable, without status epilepticus: Secondary | ICD-10-CM | POA: Insufficient documentation

## 2013-10-23 DIAGNOSIS — R7889 Finding of other specified substances, not normally found in blood: Secondary | ICD-10-CM

## 2013-10-23 DIAGNOSIS — R569 Unspecified convulsions: Secondary | ICD-10-CM

## 2013-10-23 LAB — POCT I-STAT, CHEM 8
BUN: 10 mg/dL (ref 6–23)
Calcium, Ion: 1.28 mmol/L — ABNORMAL HIGH (ref 1.12–1.23)
Chloride: 101 mEq/L (ref 96–112)
Creatinine, Ser: 1 mg/dL (ref 0.50–1.35)
Glucose, Bld: 94 mg/dL (ref 70–99)
HCT: 51 % (ref 39.0–52.0)
Hemoglobin: 17.3 g/dL — ABNORMAL HIGH (ref 13.0–17.0)
Potassium: 4.2 mEq/L (ref 3.7–5.3)
Sodium: 140 mEq/L (ref 137–147)
TCO2: 29 mmol/L (ref 0–100)

## 2013-10-23 LAB — PHENYTOIN LEVEL, TOTAL: Phenytoin Lvl: 5.9 ug/mL — ABNORMAL LOW (ref 10.0–20.0)

## 2013-10-23 MED ORDER — LORAZEPAM 2 MG/ML IJ SOLN
INTRAMUSCULAR | Status: AC
  Administered 2013-10-23: 2 mg via INTRAVENOUS
  Filled 2013-10-23: qty 1

## 2013-10-23 MED ORDER — ONDANSETRON HCL 4 MG/2ML IJ SOLN
4.0000 mg | Freq: Once | INTRAMUSCULAR | Status: AC
  Administered 2013-10-23: 4 mg via INTRAVENOUS
  Filled 2013-10-23: qty 2

## 2013-10-23 MED ORDER — SODIUM CHLORIDE 0.9 % IV BOLUS (SEPSIS)
1000.0000 mL | Freq: Once | INTRAVENOUS | Status: AC
  Administered 2013-10-23: 1000 mL via INTRAVENOUS

## 2013-10-23 MED ORDER — PHENYTOIN SODIUM EXTENDED 300 MG PO CAPS: 300.0000 mg | ORAL_CAPSULE | Freq: Every day | ORAL | Status: DC

## 2013-10-23 MED ORDER — SODIUM CHLORIDE 0.9 % IV SOLN
1000.0000 mg | Freq: Once | INTRAVENOUS | Status: AC
  Administered 2013-10-23: 1000 mg via INTRAVENOUS
  Filled 2013-10-23: qty 20

## 2013-10-23 MED ORDER — KETOROLAC TROMETHAMINE 30 MG/ML IJ SOLN
30.0000 mg | Freq: Once | INTRAMUSCULAR | Status: AC
  Administered 2013-10-23: 30 mg via INTRAVENOUS
  Filled 2013-10-23: qty 1

## 2013-10-23 MED ORDER — PHENYTOIN SODIUM EXTENDED 100 MG PO CAPS: 300.0000 mg | ORAL_CAPSULE | Freq: Every day | ORAL | Status: DC

## 2013-10-23 NOTE — ED Notes (Signed)
MD at bedside. 

## 2013-10-23 NOTE — ED Notes (Addendum)
Pt had witnessed seizure, lasted 2 minutes. Suction used, pt bit tongue. Pt put on 4L [place]. MD at bedside

## 2013-10-23 NOTE — ED Notes (Signed)
Family states last seizure on Wednesday. Pt states he has been taking dilantin regularly. Denies any recent illness. Pt states he has a headache, pt states he normally has HA after seizure. No blurred vision.

## 2013-10-23 NOTE — ED Notes (Signed)
Per EMs. Pt from home. Had witnessed seizure while sleeping this am at 0700 that lasted 2 minutes per family. Family states pt was postictal and was waking up. Then became groggy again a few hours later. Pt on dilantin.

## 2013-10-23 NOTE — Progress Notes (Signed)
[facility] provided pt with a list of primary care resources, [facility] information, and a [facility] orange Card application.

## 2013-10-23 NOTE — ED Notes (Signed)
Bed: WA13 Expected date:  Expected time:  Means of arrival:  Comments: EMS seizure 

## 2013-10-23 NOTE — ED Provider Notes (Signed)
CSN: [id number]     Arrival date & time 10/23/13  4098 History   First MD Initiated Contact with Patient 10/23/13 [phone]     Chief Complaint  Patient presents with  . Seizures   (Consider location/radiation/quality/duration/timing/severity/associated sxs/prior Treatment) HPI  22yM with seizure. Additional hx from girlfriend. Last night shaking in bed, drooling and not responsive to verbal and tactile stimuli. This episode lasted about 2 minutes and patient appeared to be sleep and snoring. Repeat episode just before arrival which is prompted ER evaluation. Patient currently has no complaints aside from feeling drowsy. She is amnestic to events earlier today. He does have a history of seizure. Cannot tell me when the last time he had one was. She reports compliance with his Dilantin. Fevers or chills. No trauma. Denies ingestion. Says his sleep has been adequate recently. No pain complaints.  Past Medical History  Diagnosis Date  . Seizures   . Asthma   . Facial trauma 04/2008   Past Surgical History  Procedure Laterality Date  . No past surgeries     History reviewed. No pertinent family history. History  Substance Use Topics  . Smoking status: Current Some Day Smoker -- 0.25 packs/day for 10 years  . Smokeless tobacco: Never Used  . Alcohol Use: Yes     Comment: occasionally    Review of Systems  All systems reviewed and negative, other than as noted in HPI.   Allergies  Amoxicillin and Zyrtec  Home Medications   Current Outpatient Rx  Name  Route  Sig  Dispense  Refill  . phenytoin (DILANTIN) 100 MG ER capsule   Oral   Take 3 capsules (300 mg total) by mouth daily.   90 capsule   2    BP 131/92  Pulse 53  Temp(Src) 98.2 F (36.8 C) (Oral)  Resp 16  SpO2 99% Physical Exam  Nursing note and vitals reviewed. Constitutional: He appears well-developed and well-nourished. No distress.  HENT:  Head: Normocephalic and atraumatic.  Eyes: Conjunctivae are normal.  Right eye exhibits no discharge. Left eye exhibits no discharge.  Neck: Neck supple.  No nuchal rigidity  Cardiovascular: Normal rate, regular rhythm and normal heart sounds.  Exam reveals no gallop and no friction rub.   No murmur heard. Pulmonary/Chest: Effort normal and breath sounds normal. No respiratory distress.  Abdominal: Soft. He exhibits no distension. There is no tenderness.  Musculoskeletal: He exhibits no edema and no tenderness.  Neurological:  Drowsy. Awakens to voice. Follows commands. CN intact. Strength 5/5 b/l u/l ext. Good finger to nose b/l.   Skin: Skin is warm and dry. He is not diaphoretic.  Psychiatric: His behavior is normal. Thought content normal.    ED Course  Procedures (including critical care time) Labs Review Labs Reviewed  POCT I-STAT, CHEM 8 - Abnormal; Notable for the following:    Calcium, Ion 1.28 (*)    Hemoglobin 17.3 (*)    All other components within normal limits  PHENYTOIN LEVEL, TOTAL   Imaging Review No results found.  EKG Interpretation   None       MDM   1. Seizure   2. Subtherapeutic serum dilantin level      11:49 AM Pt with generalized seizure. Mild bleeding from mouth and excessive secretions. Abated with ativan. Suctioned. Placed on supplemental 02 via [place] because of brief desat to 80%s. Improved to high 90s. Now with snoring respirations. Will continue to monitor.  Girlfriend at bedside now reports that pt  told her he didn't take his medicine yesterday. She's not sure if he typically does. Dilantin level pending.  4:00 PM Awaiting dilantin load. Pt with no further seizure activity. No complaints aside from feeling tired. DC home after dilantin assuming no further change in mental status.   Raeford RazorStephen Taitum Menton, MD 10/23/13 [phone]

## 2013-10-23 NOTE — Discharge Instructions (Signed)
Driving and Equipment Restrictions °Some medical problems make it dangerous to drive, ride a bike, or use machines. Some of these problems are: °· A hard blow to the head (concussion). °· Passing out (fainting). °· Twitching and shaking (seizures). °· Low blood sugar. °· Taking medicine to help you relax (sedatives). °· Taking pain medicines. °· Wearing an eye patch. °· Wearing splints. This can make it hard to use parts of your body that you need to drive safely. °HOME CARE  °· Do not drive until your doctor says it is okay. °· Do not use machines until your doctor says it is okay. °You may need a form signed by your doctor (medical release) before you can drive again. You may also need this form before you do other tasks where you need to be fully alert. °MAKE SURE YOU: °· Understand these instructions. °· Will watch your condition. °· Will get help right away if you are not doing well or get worse. °Document Released: 11/08/2004 Document Revised: 12/24/2011 Document Reviewed: 02/08/2010 °ExitCare® Patient Information ©2014 [employer]. ° °Seizure, Adult °A seizure is abnormal electrical activity in the brain. Seizures can cause a change in attention or behavior (altered mental status). Seizures often involve uncontrollable shaking (convulsions). Seizures usually last from 30 seconds to 2 minutes. Epilepsy is a brain disorder in which a patient has repeated seizures over time. °CAUSES  °There are many different problems that can cause seizures. In some cases, no cause is found. Common causes of seizures include: °· Head injuries. °· Brain tumors. °· Infections. °· Imbalance of chemicals in the blood. °· Kidney failure or liver failure. °· Heart disease. °· Drug abuse. °· Stroke. °· Withdrawal from certain drugs or alcohol. °· Birth defects. °· Malfunction of a neurosurgical device placed in the brain. °SYMPTOMS  °Symptoms vary depending on the part of the brain that is involved. Right before a seizure, you may  have a warning (aura) that a seizure is about to occur. An aura may include the following symptoms:  °· Fear or anxiety. °· Nausea. °· Feeling like the room is spinning (vertigo). °· Vision changes, such as seeing flashing lights or spots. °Common symptoms during a seizure include: °· Convulsions. °· Drooling. °· Rapid eye movements. °· Grunting. °· Loss of bladder and bowel control. °· Bitter taste in the mouth. °After a seizure, you may feel confused and sleepy. You may also have an injury resulting from convulsions during the seizure. °DIAGNOSIS  °Your caregiver will perform a physical exam and run some tests to determine the type and cause of your seizure. These tests may include: °· Blood tests. °· A lumbar puncture test. In this test, a small amount of fluid is removed from the spine and examined. °· Electrocardiography (ECG). This test records the electrical activity in your heart. °· Imaging tests, such as computed tomography (CT) scans or magnetic resonance imaging (MRI). °· Electroencephalography (EEG). This test records the electrical activity in your brain. °TREATMENT  °Seizures usually stop on their own. Treatment will depend on the cause of your seizure. In some cases, medicine may be given to prevent future seizures. °HOME CARE INSTRUCTIONS  °· If you are given medicines, take them exactly as prescribed by your caregiver. °· Keep all follow-up appointments as directed by your caregiver. °· Do not swim or drive until your caregiver says it is okay. °· Teach friends and family what to do if you have a seizure. They should: °· Lay you on the ground to   prevent a fall. °· Put a cushion under your head. °· Loosen any tight clothing around your neck. °· Turn you on your side. If vomiting occurs, this helps keep your airway clear. °· Stay with you until you recover. °SEEK IMMEDIATE MEDICAL CARE IF: °· The seizure lasts longer than 2 to 5 minutes. °· The seizure is severe or the person does not wake up after  the seizure. °· The person has altered mental status. °Drive the person to the emergency department or call your local emergency services (911 in [place] °MAKE SURE YOU: °· Understand these instructions. °· Will watch your condition. °· Will get help right away if you are not doing well or get worse. °Document Released: 09/28/2000 Document Revised: 12/24/2011 Document Reviewed: 09/19/2011 °ExitCare® Patient Information ©2014 [employer]. ° °

## 2013-12-26 ENCOUNTER — Emergency Department (HOSPITAL_COMMUNITY): Payer: Medicaid Other

## 2013-12-26 ENCOUNTER — Emergency Department (HOSPITAL_COMMUNITY)
Admission: EM | Admit: 2013-12-26 | Discharge: 2013-12-26 | Disposition: A | Discharge status: Home / Self Care | Payer: Medicaid Other | Attending: Emergency Medicine | Admitting: Emergency Medicine

## 2013-12-26 ENCOUNTER — Encounter (HOSPITAL_COMMUNITY): Payer: Self-pay | Admitting: Emergency Medicine

## 2013-12-26 DIAGNOSIS — R509 Fever, unspecified: Secondary | ICD-10-CM | POA: Insufficient documentation

## 2013-12-26 DIAGNOSIS — R599 Enlarged lymph nodes, unspecified: Secondary | ICD-10-CM | POA: Insufficient documentation

## 2013-12-26 DIAGNOSIS — IMO0001 Reserved for inherently not codable concepts without codable children: Secondary | ICD-10-CM | POA: Insufficient documentation

## 2013-12-26 DIAGNOSIS — F17200 Nicotine dependence, unspecified, uncomplicated: Secondary | ICD-10-CM | POA: Insufficient documentation

## 2013-12-26 DIAGNOSIS — R42 Dizziness and giddiness: Secondary | ICD-10-CM | POA: Insufficient documentation

## 2013-12-26 DIAGNOSIS — J020 Streptococcal pharyngitis: Secondary | ICD-10-CM | POA: Insufficient documentation

## 2013-12-26 DIAGNOSIS — G40909 Epilepsy, unspecified, not intractable, without status epilepticus: Secondary | ICD-10-CM | POA: Insufficient documentation

## 2013-12-26 DIAGNOSIS — R55 Syncope and collapse: Secondary | ICD-10-CM | POA: Insufficient documentation

## 2013-12-26 DIAGNOSIS — J45909 Unspecified asthma, uncomplicated: Secondary | ICD-10-CM | POA: Insufficient documentation

## 2013-12-26 DIAGNOSIS — Z79899 Other long term (current) drug therapy: Secondary | ICD-10-CM | POA: Insufficient documentation

## 2013-12-26 DIAGNOSIS — R1110 Vomiting, unspecified: Secondary | ICD-10-CM | POA: Insufficient documentation

## 2013-12-26 LAB — RAPID STREP SCREEN (MED CTR MEBANE ONLY): STREPTOCOCCUS, GROUP A SCREEN (DIRECT): POSITIVE — AB

## 2013-12-26 MED ORDER — DEXAMETHASONE 6 MG PO TABS
10.0000 mg | ORAL_TABLET | Freq: Once | ORAL | Status: DC
  Filled 2013-12-26: qty 1

## 2013-12-26 MED ORDER — LIDOCAINE VISCOUS 2 % MT SOLN: 20.0000 mL | OROMUCOSAL | Status: DC | PRN

## 2013-12-26 MED ORDER — PHENYTOIN SODIUM EXTENDED 300 MG PO CAPS: 300.0000 mg | ORAL_CAPSULE | Freq: Every day | ORAL | Status: DC

## 2013-12-26 MED ORDER — PSEUDOEPHEDRINE HCL ER 120 MG PO TB12: 120.0000 mg | ORAL_TABLET | Freq: Two times a day (BID) | ORAL | Status: DC | PRN

## 2013-12-26 MED ORDER — HYDROCOD POLST-CHLORPHEN POLST 10-8 MG/5ML PO LQCR
5.0000 mL | Freq: Once | ORAL | Status: AC
  Administered 2013-12-26: 5 mL via ORAL
  Filled 2013-12-26: qty 5

## 2013-12-26 MED ORDER — PENICILLIN G BENZATHINE 1200000 UNIT/2ML IM SUSP: 1.2000 10*6.[IU] | Freq: Once | INTRAMUSCULAR | Status: DC

## 2013-12-26 NOTE — ED Notes (Signed)
Pt left with law enforcement. Unable to administer medication.
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
## 2013-12-26 NOTE — ED Notes (Signed)
Pt states he has felt bad for 3 days with chills,  Fever,  Cough and body aches,  States he has passed out twice in two days,,  Denies any injury

## 2013-12-26 NOTE — Discharge Instructions (Signed)
Pharyngitis °Pharyngitis is redness, pain, and swelling (inflammation) of your pharynx.  °CAUSES  °Pharyngitis is usually caused by infection. Most of the time, these infections are from viruses (viral) and are part of a cold. However, sometimes pharyngitis is caused by bacteria (bacterial). Pharyngitis can also be caused by allergies. Viral pharyngitis may be spread from person to person by coughing, sneezing, and personal items or utensils (cups, forks, spoons, toothbrushes). Bacterial pharyngitis may be spread from person to person by more intimate contact, such as kissing.  °SIGNS AND SYMPTOMS  °Symptoms of pharyngitis include:   °· Sore throat.   °· Tiredness (fatigue).   °· Low-grade fever.   °· Headache. °· Joint pain and muscle aches. °· Skin rashes. °· Swollen lymph nodes. °· Plaque-like film on throat or tonsils (often seen with bacterial pharyngitis). °DIAGNOSIS  °Your health care provider will ask you questions about your illness and your symptoms. Your medical history, along with a physical exam, is often all that is needed to diagnose pharyngitis. Sometimes, a rapid strep test is done. Other lab tests may also be done, depending on the suspected cause.  °TREATMENT  °Viral pharyngitis will usually get better in 3 4 days without the use of medicine. Bacterial pharyngitis is treated with medicines that kill germs (antibiotics).  °HOME CARE INSTRUCTIONS  °· Drink enough water and fluids to keep your urine clear or pale yellow.   °· Only take over-the-counter or prescription medicines as directed by your health care provider:   °· If you are prescribed antibiotics, make sure you finish them even if you start to feel better.   °· Do not take aspirin.   °· Get lots of rest.   °· Gargle with 8 oz of salt water (½ tsp of salt per 1 qt of water) as often as every 1 2 hours to soothe your throat.   °· Throat lozenges (if you are not at risk for choking) or sprays may be used to soothe your throat. °SEEK MEDICAL  CARE IF:  °· You have large, tender lumps in your neck. °· You have a rash. °· You cough up green, yellow-brown, or bloody spit. °SEEK IMMEDIATE MEDICAL CARE IF:  °· Your neck becomes stiff. °· You drool or are unable to swallow liquids. °· You vomit or are unable to keep medicines or liquids down. °· You have severe pain that does not go away with the use of recommended medicines. °· You have trouble breathing (not caused by a stuffy nose). °MAKE SURE YOU:  °· Understand these instructions. °· Will watch your condition. °· Will get help right away if you are not doing well or get worse. °Document Released: 10/01/2005 Document Revised: 07/22/2013 Document Reviewed: 06/08/2013 °[employer]® Patient Information ©2014 [employer]. ° °

## 2018-05-15 ENCOUNTER — Ambulatory Visit (HOSPITAL_COMMUNITY)
Admission: EM | Admit: 2018-05-15 | Discharge: 2018-05-15 | Disposition: A | Discharge status: Home / Self Care | Payer: Self-pay | Attending: Family Medicine | Admitting: Family Medicine

## 2018-05-15 ENCOUNTER — Encounter (HOSPITAL_COMMUNITY): Payer: Self-pay

## 2018-05-15 DIAGNOSIS — Z760 Encounter for issue of repeat prescription: Secondary | ICD-10-CM

## 2018-05-15 DIAGNOSIS — G8929 Other chronic pain: Secondary | ICD-10-CM

## 2018-05-15 DIAGNOSIS — G4089 Other seizures: Secondary | ICD-10-CM

## 2018-05-15 DIAGNOSIS — M545 Low back pain, unspecified: Secondary | ICD-10-CM

## 2018-05-15 MED ORDER — IBUPROFEN 800 MG PO TABS: 800.0000 mg | ORAL_TABLET | Freq: Three times a day (TID) | ORAL | 0 refills | Status: DC

## 2018-05-15 MED ORDER — LEVETIRACETAM 750 MG PO TABS: 750.0000 mg | ORAL_TABLET | Freq: Two times a day (BID) | ORAL | 1 refills | Status: DC
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
## 2018-05-15 NOTE — ED Triage Notes (Signed)
Pt presents to get a medication refill

## 2018-05-15 NOTE — Discharge Instructions (Signed)
Take your Keppra twice a day. I have given you 1 month with 1 refill You need to find a primary care doctor within this period of time I have given ibuprofen to take as needed for back pain I recommend you follow regularly with a neurologist for your seizure disorder

## 2018-07-23 ENCOUNTER — Encounter (HOSPITAL_COMMUNITY): Payer: Self-pay

## 2018-07-23 ENCOUNTER — Emergency Department (HOSPITAL_COMMUNITY)
Admission: EM | Admit: 2018-07-23 | Discharge: 2018-07-23 | Disposition: A | Discharge status: Home / Self Care | Payer: Self-pay | Attending: Emergency Medicine | Admitting: Emergency Medicine

## 2018-07-23 ENCOUNTER — Emergency Department (HOSPITAL_COMMUNITY): Payer: Self-pay

## 2018-07-23 DIAGNOSIS — F17210 Nicotine dependence, cigarettes, uncomplicated: Secondary | ICD-10-CM | POA: Insufficient documentation

## 2018-07-23 DIAGNOSIS — Z79899 Other long term (current) drug therapy: Secondary | ICD-10-CM | POA: Insufficient documentation

## 2018-07-23 DIAGNOSIS — R569 Unspecified convulsions: Secondary | ICD-10-CM | POA: Insufficient documentation

## 2018-07-23 DIAGNOSIS — J45909 Unspecified asthma, uncomplicated: Secondary | ICD-10-CM | POA: Insufficient documentation

## 2018-07-23 LAB — BASIC METABOLIC PANEL
Anion gap: 12 (ref 5–15)
BUN: 18 mg/dL (ref 6–20)
CHLORIDE: 108 mmol/L (ref 98–111)
CO2: 23 mmol/L (ref 22–32)
CREATININE: 1.24 mg/dL (ref 0.61–1.24)
Calcium: 9.8 mg/dL (ref 8.9–10.3)
GFR calc Af Amer: >60 mL/min (ref >60)
GFR calc non Af Amer: >60 mL/min (ref >60)
GLUCOSE: 102 mg/dL — AB (ref 70–99)
POTASSIUM: 4 mmol/L (ref 3.5–5.1)
Sodium: 143 mmol/L (ref 135–145)

## 2018-07-23 LAB — CBC
HCT: 45.8 % (ref 39.0–52.0)
HEMOGLOBIN: 14.3 g/dL (ref 13.0–17.0)
MCH: 25.9 pg — AB (ref 26.0–34.0)
MCHC: 31.2 g/dL (ref 30.0–36.0)
MCV: 82.8 fL (ref 80.0–100.0)
Platelets: 251 10*3/uL (ref 150–400)
RBC: 5.53 MIL/uL (ref 4.22–5.81)
RDW: 14.7 % (ref 11.5–15.5)
WBC: 5.6 10*3/uL (ref 4.0–10.5)
nRBC: 0 % (ref 0.0–0.2)

## 2018-07-23 LAB — PHENYTOIN LEVEL, TOTAL: Phenytoin Lvl: <2.5 ug/mL — ABNORMAL LOW (ref 10.0–20.0)

## 2018-07-23 LAB — VALPROIC ACID LEVEL: Valproic Acid Lvl: <10 ug/mL — ABNORMAL LOW (ref 50.0–100.0)

## 2018-07-23 LAB — CBG MONITORING, ED: Glucose-Capillary: 130 mg/dL — ABNORMAL HIGH (ref 70–99)

## 2018-07-23 NOTE — Discharge Instructions (Signed)
Continue to take your seizure medicine as your scheduled to.  I have listed a neurologist for you to follow-up with if you are unable to get in contact with one. It is important that you re-establish care with a neurologist to discuss any medication adjustments.  Be safe!
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
## 2018-07-23 NOTE — ED Notes (Signed)
Bed: RESA Expected date:  Expected time:  Means of arrival:  Comments: EMS/Resus A

## 2018-07-23 NOTE — ED Notes (Signed)
Bed: WA16 Expected date:  Expected time:  Means of arrival:  Comments: RES A 

## 2018-07-23 NOTE — ED Triage Notes (Addendum)
Patient arrived via [facility] from work. Patient had a seizure while at work. Patient is diaphoretic and cold. Patient c/o being sick (n/v) for past couple of days. Took patient a while to come back around when EMS arrived.  -4mg  zofran given IV -250 NS given per ems.

## 2018-07-24 ENCOUNTER — Encounter: Payer: Self-pay | Admitting: Neurology

## 2018-09-17 ENCOUNTER — Encounter (HOSPITAL_COMMUNITY): Payer: Self-pay

## 2018-09-17 ENCOUNTER — Ambulatory Visit (HOSPITAL_COMMUNITY)
Admission: EM | Admit: 2018-09-17 | Discharge: 2018-09-17 | Disposition: A | Discharge status: Home / Self Care | Payer: Self-pay | Attending: Family Medicine | Admitting: Family Medicine

## 2018-09-17 DIAGNOSIS — Z79899 Other long term (current) drug therapy: Secondary | ICD-10-CM | POA: Insufficient documentation

## 2018-09-17 DIAGNOSIS — G4089 Other seizures: Secondary | ICD-10-CM

## 2018-09-17 DIAGNOSIS — Z760 Encounter for issue of repeat prescription: Secondary | ICD-10-CM

## 2018-09-17 DIAGNOSIS — F17210 Nicotine dependence, cigarettes, uncomplicated: Secondary | ICD-10-CM | POA: Insufficient documentation

## 2018-09-17 DIAGNOSIS — G40909 Epilepsy, unspecified, not intractable, without status epilepticus: Secondary | ICD-10-CM | POA: Insufficient documentation

## 2018-09-17 DIAGNOSIS — Z880 Allergy status to penicillin: Secondary | ICD-10-CM | POA: Insufficient documentation

## 2018-09-17 DIAGNOSIS — R110 Nausea: Secondary | ICD-10-CM

## 2018-09-17 LAB — CBC WITH DIFFERENTIAL/PLATELET
Abs Immature Granulocytes: 0.01 10*3/uL (ref 0.00–0.07)
Basophils Absolute: 0 10*3/uL (ref 0.0–0.1)
Basophils Relative: 1 %
Eosinophils Absolute: 0.2 10*3/uL (ref 0.0–0.5)
Eosinophils Relative: 4 %
HCT: 45.6 % (ref 39.0–52.0)
Hemoglobin: 14.1 g/dL (ref 13.0–17.0)
Immature Granulocytes: 0 %
Lymphocytes Relative: 55 %
Lymphs Abs: 2 10*3/uL (ref 0.7–4.0)
MCH: 25.5 pg — ABNORMAL LOW (ref 26.0–34.0)
MCHC: 30.9 g/dL (ref 30.0–36.0)
MCV: 82.6 fL (ref 80.0–100.0)
Monocytes Absolute: 0.3 10*3/uL (ref 0.1–1.0)
Monocytes Relative: 8 %
Neutro Abs: 1.1 10*3/uL — ABNORMAL LOW (ref 1.7–7.7)
Neutrophils Relative %: 32 %
Platelets: 323 10*3/uL (ref 150–400)
RBC: 5.52 MIL/uL (ref 4.22–5.81)
RDW: 14.2 % (ref 11.5–15.5)
WBC: 3.6 10*3/uL — ABNORMAL LOW (ref 4.0–10.5)
nRBC: 0 % (ref 0.0–0.2)

## 2018-09-17 LAB — COMPREHENSIVE METABOLIC PANEL
ALT: 14 U/L (ref 0–44)
AST: 17 U/L (ref 15–41)
Albumin: 3.9 g/dL (ref 3.5–5.0)
Alkaline Phosphatase: 51 U/L (ref 38–126)
Anion gap: 8 (ref 5–15)
BUN: 11 mg/dL (ref 6–20)
CO2: 27 mmol/L (ref 22–32)
Calcium: 9.3 mg/dL (ref 8.9–10.3)
Chloride: 106 mmol/L (ref 98–111)
Creatinine, Ser: 0.95 mg/dL (ref 0.61–1.24)
GFR calc Af Amer: >60 mL/min (ref >60)
GFR calc non Af Amer: >60 mL/min (ref >60)
Glucose, Bld: 90 mg/dL (ref 70–99)
Potassium: 5 mmol/L (ref 3.5–5.1)
Sodium: 141 mmol/L (ref 135–145)
Total Bilirubin: 0.3 mg/dL (ref 0.3–1.2)
Total Protein: 7.1 g/dL (ref 6.5–8.1)

## 2018-09-17 MED ORDER — LEVETIRACETAM 750 MG PO TABS: 1500.0000 mg | ORAL_TABLET | Freq: Every day | ORAL | 11 refills | Status: DC

## 2018-09-17 NOTE — ED Triage Notes (Signed)
Pt presents for medication refill.  
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
## 2018-10-04 ENCOUNTER — Encounter (HOSPITAL_COMMUNITY): Payer: Self-pay

## 2018-10-04 ENCOUNTER — Other Ambulatory Visit: Payer: Self-pay

## 2018-10-04 ENCOUNTER — Emergency Department (HOSPITAL_COMMUNITY)
Admission: EM | Admit: 2018-10-04 | Discharge: 2018-10-04 | Disposition: A | Discharge status: Home / Self Care | Payer: Self-pay | Attending: Emergency Medicine | Admitting: Emergency Medicine

## 2018-10-04 DIAGNOSIS — F17200 Nicotine dependence, unspecified, uncomplicated: Secondary | ICD-10-CM | POA: Insufficient documentation

## 2018-10-04 DIAGNOSIS — J45909 Unspecified asthma, uncomplicated: Secondary | ICD-10-CM | POA: Insufficient documentation

## 2018-10-04 DIAGNOSIS — Z79899 Other long term (current) drug therapy: Secondary | ICD-10-CM | POA: Insufficient documentation

## 2018-10-04 DIAGNOSIS — G40909 Epilepsy, unspecified, not intractable, without status epilepticus: Secondary | ICD-10-CM | POA: Insufficient documentation

## 2018-10-04 DIAGNOSIS — R569 Unspecified convulsions: Secondary | ICD-10-CM

## 2018-10-04 LAB — COMPREHENSIVE METABOLIC PANEL
ALBUMIN: 5.3 g/dL — AB (ref 3.5–5.0)
ALK PHOS: 66 U/L (ref 38–126)
ALT: 23 U/L (ref 0–44)
ANION GAP: 12 (ref 5–15)
AST: 24 U/L (ref 15–41)
BUN: 15 mg/dL (ref 6–20)
CALCIUM: 10.3 mg/dL (ref 8.9–10.3)
CO2: 22 mmol/L (ref 22–32)
CREATININE: 0.98 mg/dL (ref 0.61–1.24)
Chloride: 104 mmol/L (ref 98–111)
GFR calc Af Amer: >60 mL/min (ref >60)
GFR calc non Af Amer: >60 mL/min (ref >60)
GLUCOSE: 80 mg/dL (ref 70–99)
Potassium: 4.5 mmol/L (ref 3.5–5.1)
SODIUM: 138 mmol/L (ref 135–145)
Total Bilirubin: 0.5 mg/dL (ref 0.3–1.2)
Total Protein: 8.9 g/dL — ABNORMAL HIGH (ref 6.5–8.1)

## 2018-10-04 LAB — CBG MONITORING, ED: Glucose-Capillary: 133 mg/dL — ABNORMAL HIGH (ref 70–99)

## 2018-10-04 LAB — CBC WITH DIFFERENTIAL/PLATELET
ABS IMMATURE GRANULOCYTES: 0.03 10*3/uL (ref 0.00–0.07)
BASOS ABS: 0 10*3/uL (ref 0.0–0.1)
Basophils Relative: 0 %
Eosinophils Absolute: 0 10*3/uL (ref 0.0–0.5)
Eosinophils Relative: 0 %
HCT: 53.7 % — ABNORMAL HIGH (ref 39.0–52.0)
HEMOGLOBIN: 16.9 g/dL (ref 13.0–17.0)
Immature Granulocytes: 1 %
LYMPHS PCT: 16 %
Lymphs Abs: 1 10*3/uL (ref 0.7–4.0)
MCH: 25.9 pg — ABNORMAL LOW (ref 26.0–34.0)
MCHC: 31.5 g/dL (ref 30.0–36.0)
MCV: 82.2 fL (ref 80.0–100.0)
Monocytes Absolute: 0.3 10*3/uL (ref 0.1–1.0)
Monocytes Relative: 5 %
NEUTROS ABS: 4.9 10*3/uL (ref 1.7–7.7)
NRBC: 0 % (ref 0.0–0.2)
Neutrophils Relative %: 78 %
Platelets: 244 10*3/uL (ref 150–400)
RBC: 6.53 MIL/uL — AB (ref 4.22–5.81)
RDW: 14.7 % (ref 11.5–15.5)
WBC: 6.4 10*3/uL (ref 4.0–10.5)

## 2018-10-04 MED ORDER — LEVETIRACETAM 500 MG PO TABS: 1000.0000 mg | ORAL_TABLET | Freq: Once | ORAL | Status: DC

## 2018-10-04 NOTE — ED Notes (Signed)
He declines any additional v.s.

## 2018-10-04 NOTE — ED Triage Notes (Signed)
EMS were called when pt. 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 He had fallen outside and has a few small scrapes on his bilat. Hands. He is drowsy and in no distress. He has hx of seizures and takes Keppra for same.

## 2018-10-04 NOTE — ED Notes (Addendum)
Pt. Refusing to have the cardiac monitor placed while lying in the bed. Pt. Is upset. Nurse aware.

## 2018-10-04 NOTE — ED Notes (Signed)
Bed: [id number] Expected date: 10/04/18 Expected time: 9:17 AM Means of arrival:  Comments: Seizure

## 2018-10-04 NOTE — Discharge Instructions (Addendum)
We saw in the ER for your seizure. Please see your neurologist next week as planned to see if any medication adjustment is needed.
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
## 2018-10-13 ENCOUNTER — Other Ambulatory Visit: Payer: Self-pay

## 2018-10-13 ENCOUNTER — Ambulatory Visit (INDEPENDENT_AMBULATORY_CARE_PROVIDER_SITE_OTHER): Payer: Self-pay | Admitting: Neurology

## 2018-10-13 ENCOUNTER — Encounter: Payer: Self-pay | Admitting: Neurology

## 2018-10-13 VITALS — BP 112/74 | HR 77 | Wt 223.0 lb

## 2018-10-13 DIAGNOSIS — G40309 Generalized idiopathic epilepsy and epileptic syndromes, not intractable, without status epilepticus: Secondary | ICD-10-CM | POA: Insufficient documentation

## 2018-10-13 MED ORDER — LEVETIRACETAM 750 MG PO TABS: ORAL_TABLET | ORAL | 11 refills | Status: DC
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
## 2018-10-13 NOTE — Patient Instructions (Addendum)
1. Increase Levetiracetam (Keppra) 750mg : take 2 tablets in morning, take 2 tablets in evening  2. Once you have Medicaid or [facility] Financial Assistance, please call our office at [phone] to schedule 1-hour routine EEG  3. Follow-up in 6 months, call for any changes  Seizure Precautions: 1. If medication has been prescribed for you to prevent seizures, take it exactly as directed.  Do not stop taking the medicine without talking to your doctor first, even if you have not had a seizure in a long time.   2. Avoid activities in which a seizure would cause danger to yourself or to others.  Don't operate dangerous machinery, swim alone, or climb in high or dangerous places, such as on ladders, roofs, or girders.  Do not drive unless your doctor says you may.  3. If you have any warning that you may have a seizure, lay down in a safe place where you can't hurt yourself.    4.  No driving for 6 months from last seizure, as per [facility] [place] state law.   Please refer to the following link on the Epilepsy Foundation of America's website for more information: http://www.epilepsyfoundation.org/answerplace/Social/driving/drivingu.cfm   5.  Maintain good sleep hygiene. Avoid alcohol.  6.  Contact your doctor if you have any problems that may be related to the medicine you are taking.  7.  Call 911 and bring the patient back to the ED if:        A.  The seizure lasts longer than 5 minutes.       B.  The patient doesn't awaken shortly after the seizure  C.  The patient has new problems such as difficulty seeing, speaking or moving  D.  The patient was injured during the seizure  E.  The patient has a temperature over 102 F (39C)  F.  The patient vomited and now is having trouble breathing

## 2018-11-14 ENCOUNTER — Other Ambulatory Visit: Payer: Self-pay

## 2018-11-14 ENCOUNTER — Ambulatory Visit (HOSPITAL_COMMUNITY)
Admission: EM | Admit: 2018-11-14 | Discharge: 2018-11-14 | Disposition: A | Discharge status: Home / Self Care | Payer: Self-pay | Attending: Internal Medicine | Admitting: Internal Medicine

## 2018-11-14 ENCOUNTER — Encounter (HOSPITAL_COMMUNITY): Payer: Self-pay | Admitting: Emergency Medicine

## 2018-11-14 DIAGNOSIS — G8929 Other chronic pain: Secondary | ICD-10-CM

## 2018-11-14 DIAGNOSIS — M545 Other chronic pain: Secondary | ICD-10-CM

## 2018-11-14 MED ORDER — GABAPENTIN 100 MG PO CAPS: 100.0000 mg | ORAL_CAPSULE | Freq: Three times a day (TID) | ORAL | 2 refills | Status: DC

## 2018-11-14 NOTE — ED Triage Notes (Signed)
PT reports he had a spinal tap done 2-3 months ago. PT reports he could not walk for a month after the tap. PT reports continued back pain. PT reports pain is located at site of spinal tap. Pain has been present for a few months. Spinal tap was done at a hospital in [place] [facility].

## 2018-11-14 NOTE — ED Triage Notes (Signed)
PT saw neuro a few weeks ago and did not discuss his back pain.

## 2019-03-22 ENCOUNTER — Emergency Department (HOSPITAL_COMMUNITY): Payer: Medicaid Other

## 2019-03-22 ENCOUNTER — Encounter (HOSPITAL_COMMUNITY): Payer: Self-pay

## 2019-03-22 ENCOUNTER — Inpatient Hospital Stay (HOSPITAL_COMMUNITY)
Admission: EM | Admit: 2019-03-22 | Discharge: 2019-03-24 | DRG: 100 | Discharge status: Against Medical Advice | Payer: Medicaid Other | Attending: Internal Medicine | Admitting: Internal Medicine

## 2019-03-22 DIAGNOSIS — G9341 Metabolic encephalopathy: Secondary | ICD-10-CM | POA: Diagnosis present

## 2019-03-22 DIAGNOSIS — Z888 Allergy status to other drugs, medicaments and biological substances status: Secondary | ICD-10-CM

## 2019-03-22 DIAGNOSIS — J45909 Unspecified asthma, uncomplicated: Secondary | ICD-10-CM

## 2019-03-22 DIAGNOSIS — R000 Tachycardia, unspecified: Secondary | ICD-10-CM | POA: Diagnosis present

## 2019-03-22 DIAGNOSIS — G40919 Epilepsy, unspecified, intractable, without status epilepticus: Secondary | ICD-10-CM | POA: Diagnosis present

## 2019-03-22 DIAGNOSIS — Z781 Physical restraint status: Secondary | ICD-10-CM

## 2019-03-22 DIAGNOSIS — Z79899 Other long term (current) drug therapy: Secondary | ICD-10-CM

## 2019-03-22 DIAGNOSIS — Z7289 Other problems related to lifestyle: Secondary | ICD-10-CM

## 2019-03-22 DIAGNOSIS — F17210 Nicotine dependence, cigarettes, uncomplicated: Secondary | ICD-10-CM | POA: Diagnosis present

## 2019-03-22 DIAGNOSIS — R0682 Tachypnea, not elsewhere classified: Secondary | ICD-10-CM | POA: Diagnosis present

## 2019-03-22 DIAGNOSIS — R0902 Hypoxemia: Secondary | ICD-10-CM | POA: Diagnosis present

## 2019-03-22 DIAGNOSIS — Z1159 Encounter for screening for other viral diseases: Secondary | ICD-10-CM

## 2019-03-22 DIAGNOSIS — Z5329 Procedure and treatment not carried out because of patient's decision for other reasons: Secondary | ICD-10-CM | POA: Diagnosis present

## 2019-03-22 DIAGNOSIS — Z880 Allergy status to penicillin: Secondary | ICD-10-CM

## 2019-03-22 DIAGNOSIS — G40909 Epilepsy, unspecified, not intractable, without status epilepticus: Principal | ICD-10-CM | POA: Diagnosis present

## 2019-03-22 DIAGNOSIS — M6282 Rhabdomyolysis: Secondary | ICD-10-CM

## 2019-03-22 DIAGNOSIS — R569 Unspecified convulsions: Secondary | ICD-10-CM

## 2019-03-22 DIAGNOSIS — F1290 Cannabis use, unspecified, uncomplicated: Secondary | ICD-10-CM | POA: Diagnosis present

## 2019-03-22 LAB — CBG MONITORING, ED: Glucose-Capillary: 136 mg/dL — ABNORMAL HIGH (ref 70–99)

## 2019-03-22 MED ORDER — LEVETIRACETAM IN NACL 1500 MG/100ML IV SOLN
1500.0000 mg | Freq: Once | INTRAVENOUS | Status: AC
  Administered 2019-03-23: 1500 mg via INTRAVENOUS
  Filled 2019-03-22: qty 100

## 2019-03-22 MED ORDER — SODIUM CHLORIDE 0.9 % IV BOLUS (SEPSIS)
1000.0000 mL | Freq: Once | INTRAVENOUS | Status: AC
  Administered 2019-03-23: 1000 mL via INTRAVENOUS

## 2019-03-22 MED ORDER — LORAZEPAM 2 MG/ML IJ SOLN
2.0000 mg | Freq: Once | INTRAMUSCULAR | Status: AC
  Administered 2019-03-22: 2 mg via INTRAVENOUS
  Filled 2019-03-22: qty 1
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
## 2019-03-22 NOTE — ED Notes (Signed)
Bed: SH68 Expected date:  Expected time:  Means of arrival:  Comments: Combative 62M seizure

## 2019-03-22 NOTE — ED Provider Notes (Signed)
[facility]-EMERGENCY DEPT Provider Note   CSN: [id number] Arrival date & time: 03/22/19  2252    History   Chief Complaint Chief Complaint  Patient presents with  . Seizures  Level 5 caveat due to altered mental status  HPI Kyle Adams is a 28 y.o. male.     The history is provided by the EMS personnel. The history is limited by the condition of the patient.  Seizures  Episode characteristics: confusion   Postictal symptoms: confusion   Return to baseline: no   Severity:  Severe Progression:  Worsening  Patient with known history of seizures presents with seizure. Per EMS, patient had multiple seizures today.  Patient does have a history of seizures and is on Keppra.  Patient was given 7.5 versed by EMS.  They are concerned he may have aspirated.  No other details are known at this time Past Medical History:  Diagnosis Date  . Asthma   . Facial trauma 04/2008  . Seizures [facility](HCC)     Patient Active Problem List   Diagnosis Date Noted  . Generalized idiopathic epilepsy and epileptic syndromes, not intractable, without status epilepticus (HCC) 10/13/2018  . Seizure (HCC) 10/12/2011    Past Surgical History:  Procedure Laterality Date  . NO PAST SURGERIES          Home Medications    Prior to Admission medications   Medication Sig Start Date End Date Taking? Authorizing Provider  gabapentin (NEURONTIN) 100 MG capsule Take 1 capsule (100 mg total) by mouth 3 (three) times daily for 30 days. 11/14/18 12/14/18  Arnaldo Nataliamond, Michael S, MD  levETIRAcetam (KEPPRA) 750 MG tablet Take 2 tablets in AM, 2 tablets in evening 10/13/18   Van ClinesAquino, Karen M, MD    Family History No family history on file.  Social History Social History   Tobacco Use  . Smoking status: Current Some Day Smoker    Packs/day: 0.25    Years: 10.00    Pack years: 2.50  . Smokeless tobacco: Never Used  Substance Use Topics  . Alcohol use: Yes    Comment: occasionally  .  Drug use: Yes    Types: Marijuana     Allergies   Amoxicillin and Zyrtec [cetirizine]   Review of Systems Review of Systems  Unable to perform ROS: Mental status change  Neurological: Positive for seizures.     Physical Exam Updated Vital Signs BP (!) 139/95 (BP Location: Left Arm)   Pulse (!) 105   Temp 98.2 F (36.8 C) (Axillary)   Resp 15   SpO2 99%   Physical Exam CONSTITUTIONAL: disheveled, unresponsive, snoring loudly HEAD: Normocephalic/atraumatic EYES:  PERRL ENMT: Mucous membranes moist NECK: supple no meningeal signs SPINE/BACK:entire spine nontender CV: S1/S2 noted, no murmurs/rubs/gallops noted LUNGS: Lungs are clear to auscultation bilaterally, no apparent distress ABDOMEN: soft, nondistended NEURO: Pt is somnolent and intermittently combative.  Maex4.     EXTREMITIES: pulses normal/equal, full ROM, no signs of trauma SKIN: warm, color normal PSYCH: unable to assess   ED Treatments / Results  Labs (all labs ordered are listed, but only abnormal results are displayed) Labs Reviewed  BASIC METABOLIC PANEL - Abnormal; Notable for the following components:      Result Value   CO2 20 (*)    Creatinine, Ser 1.27 (*)    All other components within normal limits  CBC WITH DIFFERENTIAL/PLATELET - Abnormal; Notable for the following components:   WBC 15.1 (*)    RBC 6.17 (*)  Neutro Abs 13.1 (*)    All other components within normal limits  CK - Abnormal; Notable for the following components:   Total CK 1,554 (*)    All other components within normal limits  CK - Abnormal; Notable for the following components:   Total CK 1,916 (*)    All other components within normal limits  CBG MONITORING, ED - Abnormal; Notable for the following components:   Glucose-Capillary 136 (*)    All other components within normal limits  SARS CORONAVIRUS 2 (HOSPITAL ORDER, [facility])  HIV ANTIBODY (ROUTINE TESTING W REFLEX)  CBC  CK   PROCALCITONIN  RAPID URINE DRUG SCREEN, HOSP PERFORMED    EKG EKG Interpretation  Date/Time:  Sunday March 22 2019 22:56:43 EDT Ventricular Rate:  104 PR Interval:    QRS Duration: 99 QT Interval:  333 QTC Calculation: 438 R Axis:   49 Text Interpretation:  Sinus tachycardia Biatrial enlargement Interpretation limited secondary to artifact Confirmed by Ripley Fraise [phone]) on 03/22/2019 11:14:22 PM   Radiology Dg Chest Port 1 View  Result Date: 03/23/2019 CLINICAL DATA:  Initial evaluation for acute seizure, possible aspiration. EXAM: PORTABLE CHEST 1 VIEW COMPARISON:  Prior radiograph from 12/26/2013. FINDINGS: Cardiac and mediastinal silhouettes are stable in size and contour, and remain within normal limits. Lungs are hypoinflated. Hazy bibasilar opacities favored to reflect secondary bibasilar bronchovascular crowding. No other focal airspace disease. No pulmonary edema or pleural effusion. No pneumothorax. No acute osseous finding. IMPRESSION: Low lung volumes with secondary bibasilar bronchovascular crowding. No definite focal infiltrates identified. If there is high clinical suspicion for possible occult infiltrate/aspiration, a repeat examination with an improved degree of lung inflation may be helpful for further evaluation. Electronically Signed   By: Jeannine Boga M.D.   On: 03/23/2019 00:28    Procedures Procedures   CRITICAL CARE Performed by: Sharyon Cable Total critical care time: 50 minutes Critical care time was exclusive of separately billable procedures and treating other patients. Critical care was necessary to treat or prevent imminent or life-threatening deterioration. Critical care was time spent personally by me on the following activities: development of treatment plan with patient and/or surrogate as well as nursing, discussions with consultants, evaluation of patient's response to treatment, examination of patient, obtaining history from patient or  surrogate, ordering and performing treatments and interventions, ordering and review of laboratory studies, ordering and review of radiographic studies, pulse oximetry and re-evaluation of patient's condition. Patient required multiple reassessments.  Patient requiring oxygenation throughout his stay.  Patient required multiple IV fluid boluses Medications Ordered in ED Medications  0.9 %  sodium chloride infusion (has no administration in time range)  LORazepam (ATIVAN) injection 1-2 mg (has no administration in time range)  acetaminophen (TYLENOL) tablet 650 mg (has no administration in time range)    Or  acetaminophen (TYLENOL) suppository 650 mg (has no administration in time range)  0.9 %  sodium chloride infusion (has no administration in time range)  levETIRAcetam (KEPPRA) 750 mg in sodium chloride 0.9 % 100 mL IVPB (has no administration in time range)  LORazepam (ATIVAN) injection 2 mg (2 mg Intravenous Given 03/22/19 2318)  levETIRAcetam (KEPPRA) IVPB 1500 mg/ 100 mL premix (0 mg Intravenous Stopped 03/23/19 0024)  sodium chloride 0.9 % bolus 1,000 mL (0 mLs Intravenous Stopped 03/23/19 0138)  LORazepam (ATIVAN) injection 2 mg (2 mg Intravenous Given 03/23/19 0018)  sodium chloride 0.9 % bolus 2,000 mL (0 mLs Intravenous Stopped 03/23/19 0316)  Initial Impression / Assessment and Plan / ED Course  I have reviewed the triage vital signs and the nursing notes.  Pertinent labs & imaging results that were available during my care of the patient were reviewed by me and considered in my medical decision making (see chart for details).        11:40 PM Pt presents with seizure He is postictal, intermittently combative Imaging/labs are pending at this time Ativan given for his continued combativeness.  Home keppra dosing ordered. Will follow closely 12:23 AM Patient still confused and intermittently combative.  Restraints for safety and medical treatment. another dose of Ativan has been  ordered. Attempted to call his wife via phone, but she did not answer 1:15 AM Will obtain CT head/cspine due to prolonged altered mental status/combativeness, unclear if he has head/cspine injury Also noted to have mild rhabdo, fluids ordered 2:49 AM Pt became combative during CT imaging However he is now more directable and responds to name Will defer CT imaging for now 6:37 AM Patient monitored For several hours, patient not back to baseline.  He will have moments of alertness  and demanded discharge.  He will go back to sleep.  He also has an oxygen requirement. Also noted that his rhabdomyolysis is not improved with IV fluids. Due to continued somnolence, oxygen requirement, rhabdo, he will need to be admitted. I was able to speak to his wife via phone, she reports he has been med compliant Discussed with Dr. Loney Lohathore for admission Final Clinical Impressions(s) / ED Diagnoses   Final diagnoses:  Seizure [facility](HCC)  Non-traumatic rhabdomyolysis  Hypoxia    ED Discharge Orders    None       Zadie RhineWickline, Elleen Coulibaly, MD 03/23/19 [phone]

## 2019-03-23 ENCOUNTER — Emergency Department (HOSPITAL_COMMUNITY): Payer: Medicaid Other

## 2019-03-23 ENCOUNTER — Other Ambulatory Visit: Payer: Self-pay

## 2019-03-23 ENCOUNTER — Observation Stay (HOSPITAL_COMMUNITY): Payer: Medicaid Other

## 2019-03-23 ENCOUNTER — Inpatient Hospital Stay (HOSPITAL_COMMUNITY): Payer: Medicaid Other

## 2019-03-23 DIAGNOSIS — G40909 Epilepsy, unspecified, not intractable, without status epilepticus: Secondary | ICD-10-CM | POA: Diagnosis present

## 2019-03-23 DIAGNOSIS — Z5329 Procedure and treatment not carried out because of patient's decision for other reasons: Secondary | ICD-10-CM | POA: Diagnosis present

## 2019-03-23 DIAGNOSIS — G9341 Metabolic encephalopathy: Secondary | ICD-10-CM | POA: Diagnosis present

## 2019-03-23 DIAGNOSIS — Z79899 Other long term (current) drug therapy: Secondary | ICD-10-CM | POA: Diagnosis not present

## 2019-03-23 DIAGNOSIS — R0682 Tachypnea, not elsewhere classified: Secondary | ICD-10-CM | POA: Diagnosis present

## 2019-03-23 DIAGNOSIS — Z880 Allergy status to penicillin: Secondary | ICD-10-CM | POA: Diagnosis not present

## 2019-03-23 DIAGNOSIS — J45909 Unspecified asthma, uncomplicated: Secondary | ICD-10-CM

## 2019-03-23 DIAGNOSIS — R000 Tachycardia, unspecified: Secondary | ICD-10-CM | POA: Diagnosis present

## 2019-03-23 DIAGNOSIS — Z888 Allergy status to other drugs, medicaments and biological substances status: Secondary | ICD-10-CM | POA: Diagnosis not present

## 2019-03-23 DIAGNOSIS — G40919 Epilepsy, unspecified, intractable, without status epilepticus: Secondary | ICD-10-CM | POA: Diagnosis present

## 2019-03-23 DIAGNOSIS — M6282 Rhabdomyolysis: Secondary | ICD-10-CM | POA: Diagnosis present

## 2019-03-23 DIAGNOSIS — F1290 Cannabis use, unspecified, uncomplicated: Secondary | ICD-10-CM | POA: Diagnosis present

## 2019-03-23 DIAGNOSIS — R569 Unspecified convulsions: Secondary | ICD-10-CM | POA: Diagnosis present

## 2019-03-23 DIAGNOSIS — R0902 Hypoxemia: Secondary | ICD-10-CM | POA: Diagnosis present

## 2019-03-23 DIAGNOSIS — Z781 Physical restraint status: Secondary | ICD-10-CM | POA: Diagnosis not present

## 2019-03-23 DIAGNOSIS — F17210 Nicotine dependence, cigarettes, uncomplicated: Secondary | ICD-10-CM | POA: Diagnosis present

## 2019-03-23 DIAGNOSIS — Z7289 Other problems related to lifestyle: Secondary | ICD-10-CM | POA: Diagnosis not present

## 2019-03-23 DIAGNOSIS — Z1159 Encounter for screening for other viral diseases: Secondary | ICD-10-CM | POA: Diagnosis not present

## 2019-03-23 LAB — CBC WITH DIFFERENTIAL/PLATELET
Abs Immature Granulocytes: 0.06 10*3/uL (ref 0.00–0.07)
Basophils Absolute: 0 10*3/uL (ref 0.0–0.1)
Basophils Relative: 0 %
Eosinophils Absolute: 0 10*3/uL (ref 0.0–0.5)
Eosinophils Relative: 0 %
HCT: 52 % (ref 39.0–52.0)
Hemoglobin: 16.7 g/dL (ref 13.0–17.0)
Immature Granulocytes: 0 %
Lymphocytes Relative: 6 %
Lymphs Abs: 0.9 10*3/uL (ref 0.7–4.0)
MCH: 27.1 pg (ref 26.0–34.0)
MCHC: 32.1 g/dL (ref 30.0–36.0)
MCV: 84.3 fL (ref 80.0–100.0)
Monocytes Absolute: 1 10*3/uL (ref 0.1–1.0)
Monocytes Relative: 7 %
Neutro Abs: 13.1 10*3/uL — ABNORMAL HIGH (ref 1.7–7.7)
Neutrophils Relative %: 87 %
Platelets: 266 10*3/uL (ref 150–400)
RBC: 6.17 MIL/uL — ABNORMAL HIGH (ref 4.22–5.81)
RDW: 14.6 % (ref 11.5–15.5)
WBC: 15.1 10*3/uL — ABNORMAL HIGH (ref 4.0–10.5)
nRBC: 0 % (ref 0.0–0.2)

## 2019-03-23 LAB — PROCALCITONIN: Procalcitonin: 0.22 ng/mL

## 2019-03-23 LAB — URINALYSIS, ROUTINE W REFLEX MICROSCOPIC
Bilirubin Urine: NEGATIVE
Glucose, UA: NEGATIVE mg/dL
Hgb urine dipstick: NEGATIVE
Ketones, ur: 5 mg/dL — AB
Leukocytes,Ua: NEGATIVE
Nitrite: NEGATIVE
Protein, ur: NEGATIVE mg/dL
Specific Gravity, Urine: 1.008 (ref 1.005–1.030)
pH: 5 (ref 5.0–8.0)

## 2019-03-23 LAB — RAPID URINE DRUG SCREEN, HOSP PERFORMED
Amphetamines: NOT DETECTED
Barbiturates: NOT DETECTED
Benzodiazepines: POSITIVE — AB
Cocaine: NOT DETECTED
Opiates: NOT DETECTED
Tetrahydrocannabinol: POSITIVE — AB

## 2019-03-23 LAB — CBC
HCT: 45.8 % (ref 39.0–52.0)
Hemoglobin: 14.9 g/dL (ref 13.0–17.0)
MCH: 27.3 pg (ref 26.0–34.0)
MCHC: 32.5 g/dL (ref 30.0–36.0)
MCV: 84 fL (ref 80.0–100.0)
Platelets: 220 10*3/uL (ref 150–400)
RBC: 5.45 MIL/uL (ref 4.22–5.81)
RDW: 14.6 % (ref 11.5–15.5)
WBC: 12.8 10*3/uL — ABNORMAL HIGH (ref 4.0–10.5)
nRBC: 0 % (ref 0.0–0.2)

## 2019-03-23 LAB — BASIC METABOLIC PANEL
Anion gap: 11 (ref 5–15)
BUN: 13 mg/dL (ref 6–20)
CO2: 20 mmol/L — ABNORMAL LOW (ref 22–32)
Calcium: 9 mg/dL (ref 8.9–10.3)
Chloride: 108 mmol/L (ref 98–111)
Creatinine, Ser: 1.27 mg/dL — ABNORMAL HIGH (ref 0.61–1.24)
GFR calc Af Amer: >60 mL/min (ref >60)
GFR calc non Af Amer: >60 mL/min (ref >60)
Glucose, Bld: 98 mg/dL (ref 70–99)
Potassium: 3.9 mmol/L (ref 3.5–5.1)
Sodium: 139 mmol/L (ref 135–145)

## 2019-03-23 LAB — CK
Total CK: 1554 U/L — ABNORMAL HIGH (ref 49–397)
Total CK: 1916 U/L — ABNORMAL HIGH (ref 49–397)
Total CK: 2527 U/L — ABNORMAL HIGH (ref 49–397)

## 2019-03-23 LAB — HIV ANTIBODY (ROUTINE TESTING W REFLEX): HIV Screen 4th Generation wRfx: NONREACTIVE

## 2019-03-23 LAB — SARS CORONAVIRUS 2 BY RT PCR (HOSPITAL ORDER, PERFORMED IN ~~LOC~~ HOSPITAL LAB): SARS Coronavirus 2: NEGATIVE

## 2019-03-23 MED ORDER — HALOPERIDOL LACTATE 5 MG/ML IJ SOLN
5.0000 mg | Freq: Once | INTRAMUSCULAR | Status: AC
  Administered 2019-03-23: 5 mg via INTRAVENOUS
  Filled 2019-03-23: qty 1

## 2019-03-23 MED ORDER — LORAZEPAM 2 MG/ML IJ SOLN
2.0000 mg | Freq: Once | INTRAMUSCULAR | Status: AC
  Administered 2019-03-23: 2 mg via INTRAVENOUS
  Filled 2019-03-23: qty 1

## 2019-03-23 MED ORDER — SODIUM CHLORIDE 0.9 % IV SOLN
INTRAVENOUS | Status: AC
  Administered 2019-03-23: 08:00:00 via INTRAVENOUS

## 2019-03-23 MED ORDER — SODIUM CHLORIDE 0.9 % IV BOLUS (SEPSIS)
2000.0000 mL | Freq: Once | INTRAVENOUS | Status: AC
  Administered 2019-03-23: 2000 mL via INTRAVENOUS

## 2019-03-23 MED ORDER — SODIUM CHLORIDE 0.9 % IV SOLN
750.0000 mg | Freq: Two times a day (BID) | INTRAVENOUS | Status: DC
  Administered 2019-03-23: 750 mg via INTRAVENOUS
  Filled 2019-03-23: qty 7.5

## 2019-03-23 MED ORDER — SODIUM CHLORIDE 0.9 % IV SOLN: 75.0000 mL/h | INTRAVENOUS | Status: DC

## 2019-03-23 MED ORDER — LORAZEPAM 2 MG/ML IJ SOLN: 1.0000 mg | INTRAMUSCULAR | Status: DC | PRN

## 2019-03-23 MED ORDER — ALBUTEROL SULFATE (2.5 MG/3ML) 0.083% IN NEBU: 2.5000 mg | INHALATION_SOLUTION | Freq: Four times a day (QID) | RESPIRATORY_TRACT | Status: DC | PRN

## 2019-03-23 MED ORDER — ACETAMINOPHEN 650 MG RE SUPP: 650.0000 mg | RECTAL | Status: DC | PRN

## 2019-03-23 MED ORDER — LEVETIRACETAM IN NACL 1000 MG/100ML IV SOLN
1000.0000 mg | Freq: Two times a day (BID) | INTRAVENOUS | Status: DC
  Filled 2019-03-23 (×2): qty 100

## 2019-03-23 MED ORDER — ACETAMINOPHEN 325 MG PO TABS: 650.0000 mg | ORAL_TABLET | ORAL | Status: DC | PRN

## 2019-03-23 MED ORDER — HALOPERIDOL LACTATE 5 MG/ML IJ SOLN
2.5000 mg | Freq: Once | INTRAMUSCULAR | Status: DC
  Filled 2019-03-23: qty 1

## 2019-03-23 NOTE — ED Notes (Addendum)
Pt urinated in bed, pt washed up and changed.

## 2019-03-23 NOTE — ED Notes (Signed)
CT attempt successful.

## 2019-03-23 NOTE — ED Provider Notes (Signed)
Patient continues to be altered and noncompliant with care.  He continues to be aggressive.  We have attempted CT imaging of his head but he continues to become aggressive towards staff.  Patient will be given 5 mg IV Haldol for chemical restraint so appropriate and complete work-up can be finished here in the emergency department.  He refuses to talk to me.  He does appear to move all extremities.  We have not witnessed any additional seizure here in the emergency department.  Subclinical seizure is still a possibility.  IV Haldol now.  CT imaging now.  Patient seen and evaluated by neurology.  Patient will be transported to [facility] after completion of his head CT and arrival of the critical care transport team.  We will continue to monitor the patient closely while in the emergency department.  He will remain on telemetry.   Jola Schmidt, MD 03/23/19 1005

## 2019-03-23 NOTE — ED Notes (Addendum)
Patient transported to CT. Security called for stand by.

## 2019-03-23 NOTE — ED Notes (Signed)
Per Venora Maples, MD attempt CT scan once medication given.   CT has been called and made aware.

## 2019-03-23 NOTE — ED Notes (Signed)
CT attempted CT scan unsuccessful. Patient refusing and aggressive with CT. Patient wanting to leave. Patient has been informed on of the risks of leaving AMA. Patient not answering questions for neuro assessment. Patient sitting on side of bed.   Paged admitting provider and Venora Maples, MD.

## 2019-03-23 NOTE — ED Notes (Signed)
Pt became highly agitated yelling at staff members trying to help him. Patient became very angry yelling stating he wants to go home and for staff to call his wife. He was not staying here and that nothing is wrong with him. He stated he felt like he doesn't need to be here. Pt refused to be hooked back up to monitor to get dress.

## 2019-03-23 NOTE — Procedures (Signed)
History: 28 year old male with agitation postictally  Sedation: At all, Ativan  Technique: This is a 21 channel routine scalp EEG performed at the bedside with bipolar and monopolar montages arranged in accordance to the international 10/20 system of electrode placement. One channel was dedicated to EKG recording.    Background: The background consists anomaly of low voltage frontally predominant beta activity.  Sleep spindles and K complexes are seen.  Due to combativeness, the patient was not aroused.  Photic stimulation: Physiologic driving is performed  EEG Abnormalities: Sleep EEG  Clinical Interpretation: This normal EEG is recorded in the sleeping state. There was no seizure or seizure predisposition recorded on this study. Please note that lack of epileptiform activity on EEG does not preclude the possibility of epilepsy.   Roland Rack, MD [facility] [phone]  If 7pm- 7am, please page neurology on call as listed in [place].

## 2019-03-23 NOTE — ED Notes (Addendum)
Pt stood up in room and urinated on the floor, pt yelling about going home with aggressive language. Pt laid back down into the bed backwards and undressed. Refusing to allow staff to assist him at this time.

## 2019-03-23 NOTE — ED Notes (Signed)
CT called and made aware of scan reorder.

## 2019-03-23 NOTE — Progress Notes (Signed)
28 year old male with history of asthma, seizure disorder who is on Keppra at baseline presented to ED late last night after suffering multiple seizures at home.  Patient received 7.5 mg Versed en route through EMS.  He apparently was hypoxic on presentation requiring 2 lits O2 and there was concern for aspiration.  Labs revealed elevated CK likely secondary to multiple seizures.  Although patient did not have any witnessed tonic-clonic seizure activity in the ED, he was noted to have waxing and waning postictal mental status with periods of agitation/combativeness requiring further sedation throughout ED stay.  Per ED notes Dr. Christy Gentles was able to contact wife who apparently reported compliance with medications.   Patient was admitted to hospitalist service this morning (please refer to H&P for full details).  Admitting hospitalist obtained neurology consultation (please refer to their full consult note) and at this time anticipating transfer [facility] for further neurological evaluation/EEG for possible subclinical ongoing seizures.   Patient combative and agitated around 10 AM this morning requiring 5 mg IV Haldol and sedated during my visit.  Hemodynamically stable, saturating 90 to 93% on room air.  CT head is now completed which is unremarkable.  Urine drug screen positive for marijuana and benzodiazepines.I was unable to reach wife or grandmother at phone numbers listed under demographics.  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  Patient also has  uptrending CK levels (from 1554 to 2527) with mild AKI requiring IV hydration.  He will be upgraded to inpatient status given anticipated length of stay greater than 2 midnights for further neurological work-up/IV hydration and close monitoring of respiratory status/labs.

## 2019-03-23 NOTE — Progress Notes (Signed)
Patient admits to 5w 23, patient is asleep at this time, VS within normal range. Patient bed locked at lower position. Will continue to monitor patient.

## 2019-03-23 NOTE — ED Notes (Signed)
CT states they were unable to get scans due to patients inability to be still.

## 2019-03-23 NOTE — ED Notes (Addendum)
Patient jumped out of bed and began to urinate in sink. This RN was able to obtain urine sample with urinal while patient voided in sink.   Patient started urinating into floor and attempted to "draw back" towards this nurse as if he was going to "swat" me away. Patient had a aggressive demeanor and told this RN " Get the fuck away from me and I don't want to be here or y'all to be here".   Patient climbed back into bed. Patient refusing any equipment to be attached.  Keppra and NS paused.  Patient refusing BP cuff and pulse ox at this time.  Patient refusing fluids and medication at this time.   Lilia Pro, RN with this nurse at bedside helping deescalate the situation.   Patient was unsteady on his feet while urinating in sink.

## 2019-03-23 NOTE — ED Notes (Addendum)
Hospitalist stated that we need to move the patient into the bed correctly, during this process patient became irate again, stood up with unsteady gait screaming that he will not lay back down that he is not staying here. Pt stating "I do not give a fuck what you say I am not laying down and I am not fucking staying here" Pt eventually laid back down into bed but refuses to allow staff to place him on the monitor. Pt showing extreme agitation.

## 2019-03-23 NOTE — ED Notes (Addendum)
Pt refusing to allow this nurse to draw blood, will reassess.

## 2019-03-23 NOTE — ED Notes (Signed)
[facility] called for transport. 

## 2019-03-23 NOTE — ED Notes (Signed)
Report given to [facility]. 

## 2019-03-23 NOTE — Plan of Care (Signed)

## 2019-03-23 NOTE — ED Notes (Addendum)
Patient is disoriented and has zero insight.

## 2019-03-23 NOTE — ED Notes (Signed)
Pt seen standing in front of sink urinating on walls and floor. Pt very agitated using aggressive language and demanding to leave. Pt is unsteady on his feet. Pt did lay prone in bed refusing to be placed on monitor.

## 2019-03-23 NOTE — ED Notes (Signed)
CT yelled out for help, patient screaming at [facility] staff stating that he is going to leave and doesn't need to be here. When brought back to the room, PT fell back asleep.

## 2019-03-23 NOTE — H&P (Signed)
History and Physical    Kyle Adams IHK:[id number] DOB: 07-04-1991 DOA: 03/22/2019  PCP: Patient, No Pcp Per Patient coming from: Home  Chief Complaint: Seizures  HPI: Kyle Adams is a 28 y.o. male with medical history significant of seizures on Keppra, asthma presenting to the hospital via EMS for evaluation of seizures.  EMS gave 7.5 mg Versed.  They are concerned he may have aspirated.  Patient is currently very somnolent.  No history could be obtained from him.  ED Course: Postictal, intermittently combative.  Ativan given for continued combativeness. Continued to be confused and intermittently combative.  Restraints applied for safety. Afebrile, tachycardic, tachypneic.  White count 15.1.  BUN 13.  Creatinine 1.2, baseline 0.9-1.2.  Blood glucose 98.  CK 1554 > 1916.  COVID-19 rapid test negative.  Chest x-ray showing low lung volumes with secondary bibasilar bronchovascular crowding.  No definite focal infiltrates identified. Received IV Keppra 1500 mg, total 4 mg Ativan, and 2 L IV fluid boluses.  Review of Systems:  All systems reviewed and apart from history of presenting illness, are negative.  Past Medical History:  Diagnosis Date   Asthma    Facial trauma 04/2008   Seizures [facility])     Past Surgical History:  Procedure Laterality Date   NO PAST SURGERIES       reports that he has been smoking. He has a 2.50 pack-year smoking history. He has never used smokeless tobacco. He reports current alcohol use. He reports current drug use. Drug: Marijuana.  Allergies  Allergen Reactions   Amoxicillin Other (See Comments)    Interactions with patient phenytoin.   Zyrtec [Cetirizine] Other (See Comments)    Interactions with patient phenytoin.    No family history on file.  Prior to Admission medications   Medication Sig Start Date End Date Taking? Authorizing Provider  gabapentin (NEURONTIN) 100 MG capsule Take 1 capsule (100 mg total) by mouth 3 (three) times  daily for 30 days. 11/14/18 12/14/18  Harrie Foreman, MD  levETIRAcetam (KEPPRA) 750 MG tablet Take 2 tablets in AM, 2 tablets in evening 10/13/18   Cameron Sprang, MD    Physical Exam: Vitals:   03/23/19 0330 03/23/19 0500 03/23/19 0515 03/23/19 0700  BP: (!) 137/94 (!) 140/97 (!) 147/85   Pulse: (!) 106 (!) 103 100 97  Resp: 20 (!) 24 (!) 21 (!) 22  Temp:      TempSrc:      SpO2: 94% (!) 89% 90% 91%    Physical Exam  Constitutional: He appears well-developed and well-nourished. No distress.  HENT:  Head: Normocephalic.  Eyes: Pupils are equal, round, and reactive to light. Right eye exhibits no discharge. Left eye exhibits no discharge.  Neck: Neck supple.  Cardiovascular: Normal rate, regular rhythm and intact distal pulses.  Pulmonary/Chest: Effort normal. He has no wheezes. He has no rales.  Abdominal: Soft. Bowel sounds are normal. He exhibits no distension. There is no abdominal tenderness. There is no guarding.  Musculoskeletal:        General: No edema.  Neurological:  Very somnolent, opening eyes intermittently Not following any commands Woke up briefly and took off his nonrebreather  Skin: Skin is warm and dry. He is not diaphoretic.     Labs on Admission: I have personally reviewed following labs and imaging studies  CBC: Recent Labs  Lab 03/22/19 2314  WBC 15.1*  NEUTROABS 13.1*  HGB 16.7  HCT 52.0  MCV 84.3  PLT 266  Basic Metabolic Panel: Recent Labs  Lab 03/22/19 2314  NA 139  K 3.9  CL 108  CO2 20*  GLUCOSE 98  BUN 13  CREATININE 1.27*  CALCIUM 9.0   GFR: CrCl cannot be calculated (Unknown ideal weight.). Liver Function Tests: No results for input(s): AST, ALT, ALKPHOS, BILITOT, PROT, ALBUMIN in the last 168 hours. No results for input(s): LIPASE, AMYLASE in the last 168 hours. No results for input(s): AMMONIA in the last 168 hours. Coagulation Profile: No results for input(s): INR, PROTIME in the last 168 hours. Cardiac  Enzymes: Recent Labs  Lab 03/22/19 2327 03/23/19 0356  CKTOTAL 1,554* 1,916*   BNP (last 3 results) No results for input(s): PROBNP in the last 8760 hours. HbA1C: No results for input(s): HGBA1C in the last 72 hours. CBG: Recent Labs  Lab 03/22/19 2326  GLUCAP 136*   Lipid Profile: No results for input(s): CHOL, HDL, LDLCALC, TRIG, CHOLHDL, LDLDIRECT in the last 72 hours. Thyroid Function Tests: No results for input(s): TSH, T4TOTAL, FREET4, T3FREE, THYROIDAB in the last 72 hours. Anemia Panel: No results for input(s): VITAMINB12, FOLATE, FERRITIN, TIBC, IRON, RETICCTPCT in the last 72 hours. Urine analysis:    Component Value Date/Time   COLORURINE YELLOW 08/26/2012 1304   APPEARANCEUR CLEAR 08/26/2012 1304   LABSPEC 1.025 08/26/2012 1304   PHURINE 6.0 08/26/2012 1304   GLUCOSEU NEGATIVE 08/26/2012 1304   HGBUR NEGATIVE 08/26/2012 1304   BILIRUBINUR NEGATIVE 08/26/2012 1304   KETONESUR NEGATIVE 08/26/2012 1304   PROTEINUR NEGATIVE 08/26/2012 1304   UROBILINOGEN 0.2 08/26/2012 1304   NITRITE NEGATIVE 08/26/2012 1304   LEUKOCYTESUR NEGATIVE 08/26/2012 1304    Radiological Exams on Admission: Dg Chest Port 1 View  Result Date: 03/23/2019 CLINICAL DATA:  Initial evaluation for acute seizure, possible aspiration. EXAM: PORTABLE CHEST 1 VIEW COMPARISON:  Prior radiograph from 12/26/2013. FINDINGS: Cardiac and mediastinal silhouettes are stable in size and contour, and remain within normal limits. Lungs are hypoinflated. Hazy bibasilar opacities favored to reflect secondary bibasilar bronchovascular crowding. No other focal airspace disease. No pulmonary edema or pleural effusion. No pneumothorax. No acute osseous finding. IMPRESSION: Low lung volumes with secondary bibasilar bronchovascular crowding. No definite focal infiltrates identified. If there is high clinical suspicion for possible occult infiltrate/aspiration, a repeat examination with an improved degree of lung  inflation may be helpful for further evaluation. Electronically Signed   By: Rise MuBenjamin  McClintock M.D.   On: 03/23/2019 00:28    EKG: Independently reviewed.  Sinus tachycardia (heart rate 104).  Assessment/Plan Principal Problem:   Breakthrough seizure (HCC) Active Problems:   Rhabdomyolysis   Asthma   Breakthrough seizures Wife reported to ED provider that patient has been compliant with Keppra. ?Infection as a precipitating factor.  Afebrile.  White count 15.1. Received Versed and doses of Ativan.  Placed on nonrebreather in the ED. Patient refuses to wear nonrebreather or nasal cannula.  SPO2 94% on room air.  When I went to see the patient he was very somnolent and opening eyes only intermittently.  Not following any commands or answering questions, however, did wake up briefly to take off his nonrebreather mask.  Last dose of Ativan was approximately 6 hours ago.  Nursing staff informed me that patient does wake up and becomes combative.  Informed me that not too long ago patient had gotten out of the bed and urinated on the floor.  They have tried taking him for a head CT multiple times but he becomes combative.  Patient has not  had any further episodes of seizures since he has been in the ED. -Discussed with neurology.  [facility]  Neurology will consult. -Monitor in the progressive care unit -Received IV Keppra 1500 mg loading dose.  Continue home Keppra 750 mg twice daily. -Ativan PRN -Seizure precautions -Frequent neurochecks -Continuous pulse ox -EEG -STAT head CT -Check UDS  Rhabdomyolysis secondary to seizures CK 1554 > 1916.  No evidence of AKI.  BUN 13.  Creatinine 1.2, baseline ranging from 0.9-1.2. -Continue aggressive IV fluid hydration -Continue to trend CK -Check UA  Possible aspiration pneumonia Afebrile.  White count 15.1. Chest x-ray with questionable pneumonia.  ?Aspiration secondary to seizures.  COVID-19 rapid test negative.   Placed on nonrebreather by EMS, unclear whether he was hypoxic.  He has received Versed and doses of Ativan.  When I was present in the room patient briefly woke up and took off his nonrebreather.  Nursing staff informed me that he refuses to wear nonrebreather or supplemental oxygen.  SPO2 currently 94% on room air. -Continuous pulse ox -Repeat chest x-ray has been ordered and currently pending -Check STAT procalcitonin level -Continue to monitor CBC  Asthma -Stable.  No bronchospasm.  Albuterol PRN.  DVT prophylaxis: SCDs at this time.  Head CT pending. Code Status: Full code Family Communication: No family available at this time. Disposition Plan: Anticipate discharge after clinical improvement. Consults called: Neurology (Dr. Wilford CornerArora) Admission status: It is my clinical opinion that referral for OBSERVATION is reasonable and necessary in this patient based on the above information provided. The aforementioned taken together are felt to place the patient at high risk for further clinical deterioration. However it is anticipated that the patient may be medically stable for discharge from the hospital within 24 to 48 hours.  The medical decision making on this patient was of high complexity and the patient is at high risk for clinical deterioration, therefore this is a level 3 visit.  John GiovanniVasundhra Vola Beneke MD [facility] Pager [phone]- [phone]  If 7PM-7AM, please contact night-coverage [email or web address] Password [email or web address]  03/23/2019, 7:03 AM

## 2019-03-23 NOTE — ED Notes (Signed)
Attempted to move pt from prone position into new clean bed. Pt woke up climbed out of bed being aggressive using vulgar language and demanding to leave. Pt still unstable and becoming aggressive with more frequency.

## 2019-03-23 NOTE — ED Notes (Signed)
Campos, MD at bedside.  

## 2019-03-23 NOTE — Progress Notes (Signed)
EEG completed, results pending. 

## 2019-03-23 NOTE — ED Notes (Signed)
Called bed placement. No beds available at this time [facility].

## 2019-03-23 NOTE — Progress Notes (Addendum)
Patient was seen leaving  room dressed in street clothes and sandals.  This Probation officer   encouraged pt  to return to the room but pt ran through the emergency exit. Staff followed pt but  retreated back to the unit at [facility] deck  after unsuccessful attempts to encourage pt to return to the unit.  Pt was last seen at [facility]. He was running towards [employer] [address].   Pt  was agitated earlier  At 2330 and reported that he wanted to leave.  Hospitalist on call  Baltazar Najjar NP notified with order  received to give one time order 2.5 mg IV Haldol. Pt refused Haldol. He stated that he was fine and don't need any medication. He also refused IV Keppra , telemetry monitoring and all medical interventions.     This Probation officer checked on pt at 0400 and pt stated he wanted to go home. Pt was educated on  leaving against medical advice given his current condition but pt was adamant. " I don't care.I  have had seizures since I was 17 and would take PO meds at home. Please get out of my face" .  Pt  reacted  Angrily with increased agitation. This writer left the room check on another pt .   Pt was Alert to self, place, time on assessment. Pt couldn't however recall events preceding to admission but was able to give detail account of other events  Np Baltazar Najjar made notified about pt leaving the unit

## 2019-03-23 NOTE — ED Notes (Addendum)
Pt refusing to keep any clothes, oxygen, or monitor on, continues to rip it off and throw in the floor.

## 2019-03-23 NOTE — ED Notes (Addendum)
Orders placed for safety precaution 1 to 1 observation. Per night shift and notes patient is extremely aggressive and agitated when awake. Patient attempts to pull off lines and cords after multiple attempts to keep them on by night shift. Patient is asleep on this RN rounding. Notes in chart suggest patient would benefit from a Air cabin crew. Patient has threatened night shift RN that he will leave. Patient is not IVC. MD have been made aware per night shift notes and still not IVC'd at this time. Soft restraints at bedside but are not in use at this time.   Patient would not keep condom catheter on per night shift and voided on floor. Unable to collect urine sample at this time. Urinal at bedside.   Unable to obtain BP due to patient pulling off equipment. MD aware and agreed to have pulse ox on patient only for now to monitor.

## 2019-03-23 NOTE — Consult Note (Addendum)
Neurology Consultation  Reason for Consult: Breakthrough seizure Referring Physician: Earnest Conroy,  CC: Breakthrough seizure  History is obtained from: Chart only as patient will not take part in any portion of consultation and or history.  HPI: Kyle Adams is a 28 y.o. male with history of seizure, facial trauma and asthma.  As patient was possibly postictal and at this time will not give any history this is a very brief history.  Per ED note EMS noted multiple seizures.  At that time he was postictal.  He is on Keppra but I am not sure if he is taking his Keppra.  Patient was given seven-point milligrams of Versed on transport.   Attempt was made to contact his wife however, only busy signal was obtained  ED course: Patient was postictal and combative when attempted to be awoken.  Of note he did have an elevated white blood cell count of 15.1 with a BUN of 13 and creatinine of 1.2.  He also had a CK of 1554 which has now trended up to 2527.  Currently still in ED and extremely combative if attempted to be awoken   ROS:  Unable to obtain due to refusing any part of exam and or history.   Past Medical History:  Diagnosis Date  . Asthma   . Facial trauma 04/2008  . Seizures ([place])      No family history on file.  And unable to obtain from patient   Social History:   reports that he has been smoking. He has a 2.50 pack-year smoking history. He has never used smokeless tobacco. He reports current alcohol use. He reports current drug use. Drug: Marijuana.-Obtained from previous chart  Medications  Current Facility-Administered Medications:  .  0.9 %  sodium chloride infusion, , Intravenous, Continuous, Shela Leff, MD, Stopped at 03/23/19 [phone] .  acetaminophen (TYLENOL) tablet 650 mg, 650 mg, Oral, Q4H PRN **OR** acetaminophen (TYLENOL) suppository 650 mg, 650 mg, Rectal, Q4H PRN, Shela Leff, MD .  albuterol (PROVENTIL) (2.5 MG/3ML) 0.083% nebulizer solution 2.5 mg,  2.5 mg, Nebulization, Q6H PRN, Shela Leff, MD .  levETIRAcetam (KEPPRA) 750 mg in sodium chloride 0.9 % 100 mL IVPB, 750 mg, Intravenous, Q12H, Shela Leff, MD, Stopped at 03/23/19 [phone] .  LORazepam (ATIVAN) injection 1-2 mg, 1-2 mg, Intravenous, Q2H PRN, Shela Leff, MD  Current Outpatient Medications:  .  gabapentin (NEURONTIN) 100 MG capsule, Take 1 capsule (100 mg total) by mouth 3 (three) times daily for 30 days., Disp: 90 capsule, Rfl: 2 .  levETIRAcetam (KEPPRA) 750 MG tablet, Take 2 tablets in AM, 2 tablets in evening, Disp: 120 tablet, Rfl: 11   Exam: Current vital signs: BP (!) 149/94   Pulse 99   Temp 98.9 F (37.2 C) (Rectal)   Resp 20   SpO2 95%  Vital signs in last 24 hours: Temp:  [98.2 F (36.8 C)-98.9 F (37.2 C)] 98.9 F (37.2 C) (06/07 2342) Pulse Rate:  [95-138] 99 (06/08 0830) Resp:  [15-27] 20 (06/08 0830) BP: (129-198)/(76-111) 149/94 (06/08 0830) SpO2:  [89 %-99 %] 95 % (06/08 0830)  Physical Exam  Constitutional: Appears well-developed and well-nourished.  Psych: Extremely agitated Eyes: No scleral injection HENT: No OP obstrucion Head: Normocephalic.  Cardiovascular: Normal rate and regular rhythm.  Respiratory: Effort normal, non-labored breathing GI: Soft.  No distension. There is no tenderness.  Skin: WDI  Neuro: Mental Status: Patient is awake, alert, however at this point we will not take part in any portion  of exam With sternal rub or any noxious stimuli patient does awaken extremely quickly, states "leave me alone", "I want no part of this", "I just want to leave" Cranial Nerves: Face symmetrical, extraocular motions intact, moving all extremities 5/5, reflexes 2+.  Unable to obtain any other aspects of cranial nerves and or neurological exam due to patient's agitation and attempts to do harm to practitioner   CBC    Component Value Date/Time   WBC 12.8 (H) 03/23/2019 0657   RBC 5.45 03/23/2019 0657   HGB 14.9  03/23/2019 0657   HCT 45.8 03/23/2019 0657   PLT 220 03/23/2019 0657   MCV 84.0 03/23/2019 0657   MCH 27.3 03/23/2019 0657   MCHC 32.5 03/23/2019 0657   RDW 14.6 03/23/2019 0657   LYMPHSABS 0.9 03/22/2019 2314   MONOABS 1.0 03/22/2019 2314   EOSABS 0.0 03/22/2019 2314   BASOSABS 0.0 03/22/2019 2314    CMP     Component Value Date/Time   NA 139 03/22/2019 2314   K 3.9 03/22/2019 2314   CL 108 03/22/2019 2314   CO2 20 (L) 03/22/2019 2314   GLUCOSE 98 03/22/2019 2314   BUN 13 03/22/2019 2314   CREATININE 1.27 (H) 03/22/2019 2314   CALCIUM 9.0 03/22/2019 2314   PROT 8.9 (H) 10/04/2018 1125   ALBUMIN 5.3 (H) 10/04/2018 1125   AST 24 10/04/2018 1125   ALT 23 10/04/2018 1125   ALKPHOS 66 10/04/2018 1125   BILITOT 0.5 10/04/2018 1125   GFRNONAA >60 03/22/2019 2314   GFRAA >60 03/22/2019 2314    Lipid Panel  No results found for: CHOL, TRIG, HDL, CHOLHDL, VLDL, LDLCALC, LDLDIRECT   Imaging I have reviewed the images obtained:  CT-scan of the brain-CT pending    I have seen the patient and reviewed the above note.  On my exam, he is extremely lethargic and tells me to leave him alone.  Assessment:  Breakthrough seizure in the setting of a patient who has a seizure history.  Apparently 1 of the ED docs did get hold of the wife who stated that he has been compliant with his medications.  Unfortunately no other history at this point time is obtained as patient is not willing to provide this.     Recommendations: -Transfer to [facility] when [facility] - Continue 750 mg Keppra twice daily - Continue seizure precautions - Obtain CT if possible - If patient remains what appears to be postictal will likely need [facility] - Attempts have been made to tell the patient the following "  -Per [place] statutes, patients with seizures are not allowed to drive until  they have been seizure-free for six months. Use caution when using heavy equipment or power tools.  Avoid working on ladders or at heights. Take showers instead of baths. Ensure the water temperature is not too high on the home water heater. Do not go swimming alone. When caring for infants or small children, sit down when holding, feeding, or changing them to minimize risk of injury to the child in the event you have a seizure.   Also, Maintain good sleep hygiene. Avoid alcohol.  At this point patient did not respond to practitioner explained the [facility] seizure precautions.  Dr. Amada JupiterKirkpatrick to make any changes in consultation note.  Felicie Mornavid Smith PA-C [facility] [phone]  M-F  (9:00 am- 5:00 PM)  03/23/2019, 9:12 AM    I have seen the patient reviewed the above note.  He  had a breakthrough seizure on 750 twice daily of Keppra.  I would favor increasing this to a gram twice a day.  By the time of my exam, he had received multiple doses of benzodiazepine as well as Haldol for postictal agitation and was very somnolent and relatively uncooperative with exam.  He does tell me "leave me alone."  Stat EEG was performed to rule out ongoing seizure which was negative for seizure.  Ritta SlotMcNeill Adrea Sherpa, MD [facility] [phone]  If 7pm- 7am, please page neurology on call as listed in AMION.

## 2019-03-23 NOTE — ED Notes (Signed)
Patient resting with eyes closed at this time. Equal chest rise and fall noted. Patient on cardiac monitoring.

## 2019-03-23 NOTE — ED Notes (Addendum)
Unable to complete neuro checks fully due to patient refusing and not speaking or answering questions at this time. Unable to cardiac monitor due to patient taking cords off.   Patient is not IVC'd at this time.  This RN is not using non violent restraints at this time.   Patient has freedom from movement.

## 2019-03-24 NOTE — Progress Notes (Addendum)
Note: Notified by RN that pt dressed and left AMA through the emergency entrance. Had been complaining because he was requesting fluids and food regularly. When explained about his fluid restriction he became angry. Started to refuse all meds and interventions. Apparently when RN left the room pt left. It is noted that pt was recently admitted and left AMA as well.   Jeryl Columbia, NP-C [facility] Pager [phone]

## 2019-03-26 NOTE — Discharge Summary (Signed)
Physician Discharge Summary  Kyle Adams [id number] DOB: Mar 10, 1991 DOA: 03/22/2019  PCP: Patient, No Pcp Per  Admit date: 03/22/2019 Discharge date: Left AMA ON 6/9 Consultations: Neurology Admitted From: home Disposition:  Left AMA  Discharge Diagnoses:  Principal Problem:   Breakthrough seizure (HCC) Active Problems:   Rhabdomyolysis   Asthma   Metabolic encephalopathy   Brief/Interim Summary: 28 year old male with history of asthma, seizure disorder who is on Keppra at baseline presented to ED on the night of 6/7 after suffering multiple seizures at home.  Patient received 7.5 mg Versed en route through EMS.  He apparently was hypoxic on presentation requiring 2 lits O2 and there was concern for aspiration.  Labs revealed elevated CK likely secondary to multiple seizures.  Although patient did not have any witnessed tonic-clonic seizure activity in the ED, he was noted to have waxing and waning postictal mental status with periods of agitation/combativeness requiring further sedation throughout ED stay.  Per ED notes Dr. Bebe ShaggyWickline was able to contact wife who apparently reported compliance with medications.   Patient was [facility] on the morning of 6/8 (please refer to H&P for full details) and admission process was completed. Admitting hospitalist obtained neurology consultation (please refer to their full consult note) who recommended [facility] for further neurological evaluation/EEG for possible subclinical ongoing seizures.   Patient became combative and agitated around 10 AM that morning again requiring 5 mg IV Haldol by Dr Patria Maneampos and was sedated during my brief visit. He Hemodynamically stable, saturating 90 to 93% on room air.  CT head was unremarkable.  Urine drug screen positive for marijuana and benzodiazepines.I was unable to reach wife or grandmother at phone numbers listed under demographics but patient [facility]  later that night for closer monitoring of labs given elevated CK and AKI as well as to complete neurologic w/u including  EEG by neurology. Per record, patient left AMA on the morning of 6/9 ( around 4.30 am)  prior to be seen by rounding hospitalist at [facility]. According to nursing note patient was AAO X3 and had insight into his medical condition (although angry) when he left the hospital.  D/C note done by me as last seen physician ([facility]) for completion of medical records. This encounter will not be billed as I did not have face to face contact with patient on the day of discharge.   Discharge Exam: Vitals:   03/23/19 1437 03/23/19 2226  BP: (!) 132/91 (!) 142/92  Pulse: 89 75  Resp: 20 18  Temp:  98 F (36.7 C)  SpO2: 95% 100%   Vitals:   03/23/19 1308 03/23/19 1330 03/23/19 1437 03/23/19 2226  BP: 139/88 (!) 141/81 (!) 132/91 (!) 142/92  Pulse: 96 95 89 75  Resp: 20 18 20 18   Temp:    98 F (36.7 C)  TempSrc:    Oral  SpO2: 91% 93% 95% 100%  Weight:   101.2 kg         The results of significant diagnostics from this hospitalization (including imaging, microbiology, ancillary and laboratory) are listed below for reference.     Microbiology: Recent Results (from the past 240 hour(s))  SARS Coronavirus 2 (CEPHEID - Performed in [facility] hospital lab), Hosp Order     Status: None   Collection Time: 03/22/19 11:35 PM   Specimen: Nasopharyngeal Swab  Result Value Ref Range Status   SARS Coronavirus 2 NEGATIVE NEGATIVE  Final    Comment: (NOTE) If result is NEGATIVE SARS-CoV-2 target nucleic acids are NOT DETECTED. The SARS-CoV-2 RNA is generally detectable in upper and lower  respiratory specimens during the acute phase of infection. The lowest  concentration of SARS-CoV-2 viral copies this assay can detect is 250  copies / mL. A negative result does not preclude SARS-CoV-2 infection  and should not be used as the sole basis for treatment or other  patient  management decisions.  A negative result may occur with  improper specimen collection / handling, submission of specimen other  than nasopharyngeal swab, presence of viral mutation(s) within the  areas targeted by this assay, and inadequate number of viral copies  (<250 copies / mL). A negative result must be combined with clinical  observations, patient history, and epidemiological information. If result is POSITIVE SARS-CoV-2 target nucleic acids are DETECTED. The SARS-CoV-2 RNA is generally detectable in upper and lower  respiratory specimens dur ing the acute phase of infection.  Positive  results are indicative of active infection with SARS-CoV-2.  Clinical  correlation with patient history and other diagnostic information is  necessary to determine patient infection status.  Positive results do  not rule out bacterial infection or co-infection with other viruses. If result is PRESUMPTIVE POSTIVE SARS-CoV-2 nucleic acids MAY BE PRESENT.   A presumptive positive result was obtained on the submitted specimen  and confirmed on repeat testing.  While 2019 novel coronavirus  (SARS-CoV-2) nucleic acids may be present in the submitted sample  additional confirmatory testing may be necessary for epidemiological  and / or clinical management purposes  to differentiate between  SARS-CoV-2 and other Sarbecovirus currently known to infect humans.  If clinically indicated additional testing with an alternate test  methodology [phone]([id number]) is advised. The SARS-CoV-2 RNA is generally  detectable in upper and lower respiratory sp ecimens during the acute  phase of infection. The expected result is Negative. Fact Sheet for Patients:  BoilerBrush.com.cyhttps://www.fda.gov/media/136312/download Fact Sheet for Healthcare Providers: https://pope.com/https://www.fda.gov/media/136313/download This test is not yet approved or cleared by the [place] FDA and has been authorized for detection and/or diagnosis of SARS-CoV-2 by FDA under  an Emergency Use Authorization (EUA).  This EUA will remain in effect (meaning this test can be used) for the duration of the COVID-19 declaration under Section 564(b)(1) of the Act, 21 U.S.C. section 360bbb-3(b)(1), unless the authorization is terminated or revoked sooner. Performed at [facility], [address] [place], [place] [address]      Labs: BNP (last 3 results) No results for input(s): BNP in the last 8760 hours. Basic Metabolic Panel: Recent Labs  Lab 03/22/19 2314  NA 139  K 3.9  CL 108  CO2 20*  GLUCOSE 98  BUN 13  CREATININE 1.27*  CALCIUM 9.0   Liver Function Tests: No results for input(s): AST, ALT, ALKPHOS, BILITOT, PROT, ALBUMIN in the last 168 hours. No results for input(s): LIPASE, AMYLASE in the last 168 hours. No results for input(s): AMMONIA in the last 168 hours. CBC: Recent Labs  Lab 03/22/19 2314 03/23/19 0657  WBC 15.1* 12.8*  NEUTROABS 13.1*  --   HGB 16.7 14.9  HCT 52.0 45.8  MCV 84.3 84.0  PLT 266 220   Cardiac Enzymes: Recent Labs  Lab 03/22/19 2327 03/23/19 0356 03/23/19 0657  CKTOTAL 1,554* 1,916* 2,527*   BNP: Invalid input(s): POCBNP CBG: Recent Labs  Lab 03/22/19 2326  GLUCAP 136*   D-Dimer No results for input(s): DDIMER in the  last 72 hours. Hgb A1c No results for input(s): HGBA1C in the last 72 hours. Lipid Profile No results for input(s): CHOL, HDL, LDLCALC, TRIG, CHOLHDL, LDLDIRECT in the last 72 hours. Thyroid function studies No results for input(s): TSH, T4TOTAL, T3FREE, THYROIDAB in the last 72 hours.  Invalid input(s): FREET3 Anemia work up No results for input(s): VITAMINB12, FOLATE, FERRITIN, TIBC, IRON, RETICCTPCT in the last 72 hours. Urinalysis    Component Value Date/Time   COLORURINE STRAW (A) 03/23/2019 0659   APPEARANCEUR CLEAR 03/23/2019 0659   LABSPEC 1.008 03/23/2019 0659   PHURINE 5.0 03/23/2019 0659   GLUCOSEU NEGATIVE 03/23/2019 0659   HGBUR NEGATIVE  03/23/2019 0659   BILIRUBINUR NEGATIVE 03/23/2019 0659   KETONESUR 5 (A) 03/23/2019 0659   PROTEINUR NEGATIVE 03/23/2019 0659   UROBILINOGEN 0.2 08/26/2012 1304   NITRITE NEGATIVE 03/23/2019 0659   LEUKOCYTESUR NEGATIVE 03/23/2019 0659   Sepsis Labs Invalid input(s): PROCALCITONIN,  WBC,  LACTICIDVEN Microbiology Recent Results (from the past 240 hour(s))  SARS Coronavirus 2 (CEPHEID - Performed in [facility] hospital lab), Hosp Order     Status: None   Collection Time: 03/22/19 11:35 PM   Specimen: Nasopharyngeal Swab  Result Value Ref Range Status   SARS Coronavirus 2 NEGATIVE NEGATIVE Final    Comment: (NOTE) If result is NEGATIVE SARS-CoV-2 target nucleic acids are NOT DETECTED. The SARS-CoV-2 RNA is generally detectable in upper and lower  respiratory specimens during the acute phase of infection. The lowest  concentration of SARS-CoV-2 viral copies this assay can detect is 250  copies / mL. A negative result does not preclude SARS-CoV-2 infection  and should not be used as the sole basis for treatment or other  patient management decisions.  A negative result may occur with  improper specimen collection / handling, submission of specimen other  than nasopharyngeal swab, presence of viral mutation(s) within the  areas targeted by this assay, and inadequate number of viral copies  (<250 copies / mL). A negative result must be combined with clinical  observations, patient history, and epidemiological information. If result is POSITIVE SARS-CoV-2 target nucleic acids are DETECTED. The SARS-CoV-2 RNA is generally detectable in upper and lower  respiratory specimens dur ing the acute phase of infection.  Positive  results are indicative of active infection with SARS-CoV-2.  Clinical  correlation with patient history and other diagnostic information is  necessary to determine patient infection status.  Positive results do  not rule out bacterial infection or co-infection with  other viruses. If result is PRESUMPTIVE POSTIVE SARS-CoV-2 nucleic acids MAY BE PRESENT.   A presumptive positive result was obtained on the submitted specimen  and confirmed on repeat testing.  While 2019 novel coronavirus  (SARS-CoV-2) nucleic acids may be present in the submitted sample  additional confirmatory testing may be necessary for epidemiological  and / or clinical management purposes  to differentiate between  SARS-CoV-2 and other Sarbecovirus currently known to infect humans.  If clinically indicated additional testing with an alternate test  methodology [phone]([id number]) is advised. The SARS-CoV-2 RNA is generally  detectable in upper and lower respiratory sp ecimens during the acute  phase of infection. The expected result is Negative. Fact Sheet for Patients:  BoilerBrush.com.cyhttps://www.fda.gov/media/136312/download Fact Sheet for Healthcare Providers: https://pope.com/https://www.fda.gov/media/136313/download This test is not yet approved or cleared by the [place] FDA and has been authorized for detection and/or diagnosis of SARS-CoV-2 by FDA under an Emergency Use Authorization (EUA).  This EUA will remain in effect (meaning this test  can be used) for the duration of the COVID-19 declaration under Section 564(b)(1) of the Act, 21 U.S.C. section 360bbb-3(b)(1), unless the authorization is terminated or revoked sooner. Performed at [facility], [place] [address], [place],  [address]     Procedures/Studies: Ct Head Wo Contrast  Result Date: 03/23/2019 CLINICAL DATA:  Altered mental status with questionable seizure. EXAM: CT HEAD WITHOUT CONTRAST TECHNIQUE: Contiguous axial images were obtained from the base of the skull through the vertex without intravenous contrast. COMPARISON:  July 23, 2017 FINDINGS: Brain: The ventricles are normal in size and configuration. There is no intracranial mass, hemorrhage, extra-axial fluid collection, or midline shift. Brain parenchyma appears  unremarkable. No acute infarct evident. Vascular: There is no hyperdense vessel. There is no vascular calcification. Skull: The bony calvarium appears intact. Sinuses/Orbits: There is a retention cyst in the posterior, superior right maxillary antrum. There is slight opacification in the mid left ethmoid air cell region. Orbits appear symmetric bilaterally. Other: Visualized mastoid air cells are clear. IMPRESSION: Mild paranasal sinus disease.  Study otherwise unremarkable. Electronically Signed   By: Lowella Grip III M.D.   On: 03/23/2019 11:05   Dg Chest Port 1 View  Result Date: 03/23/2019 CLINICAL DATA:  Seizure EXAM: PORTABLE CHEST 1 VIEW COMPARISON:  March 22, 2019. FINDINGS: There is no edema or consolidation. The heart size and pulmonary vascularity are normal. No adenopathy. No bone lesions. IMPRESSION: No edema or consolidation. Electronically Signed   By: Lowella Grip III M.D.   On: 03/23/2019 08:16   Dg Chest Port 1 View  Result Date: 03/23/2019 CLINICAL DATA:  Initial evaluation for acute seizure, possible aspiration. EXAM: PORTABLE CHEST 1 VIEW COMPARISON:  Prior radiograph from 12/26/2013. FINDINGS: Cardiac and mediastinal silhouettes are stable in size and contour, and remain within normal limits. Lungs are hypoinflated. Hazy bibasilar opacities favored to reflect secondary bibasilar bronchovascular crowding. No other focal airspace disease. No pulmonary edema or pleural effusion. No pneumothorax. No acute osseous finding. IMPRESSION: Low lung volumes with secondary bibasilar bronchovascular crowding. No definite focal infiltrates identified. If there is high clinical suspicion for possible occult infiltrate/aspiration, a repeat examination with an improved degree of lung inflation may be helpful for further evaluation. Electronically Signed   By: Jeannine Boga M.D.   On: 03/23/2019 00:28    (Echo, Carotid, EGD, Colonoscopy, ERCP)  Time coordinating discharge: Over 30  minutes  SIGNED:   Guilford Shi, MD  [facility] 03/26/2019, 5:31 PM Pager   If 7PM-7AM, please contact night-coverage [email or web address] Password [email or web address]

## 2019-03-30 LAB — URINE DRUGS OF ABUSE SCREEN W ALC, ROUTINE (REF LAB)
Amphetamines, Urine: NEGATIVE ng/mL
Barbiturate, Ur: NEGATIVE ng/mL
Benzodiazepine Quant, Ur: UNDETERMINED ng/mL
Cannabinoid Quant, Ur: UNDETERMINED ng/mL
Cocaine (Metab.): NEGATIVE ng/mL
Ethanol U, Quan: NEGATIVE %
Methadone Screen, Urine: NEGATIVE ng/mL
Opiate Quant, Ur: NEGATIVE ng/mL
Phencyclidine, Ur: NEGATIVE ng/mL
Propoxyphene, Urine: NEGATIVE ng/mL

## 2019-04-09 ENCOUNTER — Encounter: Payer: Self-pay | Admitting: Neurology

## 2019-04-15 ENCOUNTER — Ambulatory Visit: Payer: Self-pay | Admitting: Neurology

## 2019-04-22 ENCOUNTER — Encounter: Payer: Self-pay | Admitting: Neurology

## 2019-04-22 ENCOUNTER — Other Ambulatory Visit: Payer: Self-pay

## 2019-04-22 ENCOUNTER — Ambulatory Visit (INDEPENDENT_AMBULATORY_CARE_PROVIDER_SITE_OTHER): Payer: Self-pay | Admitting: Neurology

## 2019-04-22 VITALS — BP 128/72 | HR 77 | Ht 69.0 in | Wt 227.0 lb

## 2019-04-22 DIAGNOSIS — G40309 Generalized idiopathic epilepsy and epileptic syndromes, not intractable, without status epilepticus: Secondary | ICD-10-CM

## 2019-04-22 MED ORDER — APTIOM 600 MG PO TABS: ORAL_TABLET | ORAL | 11 refills | Status: DC

## 2019-04-22 MED ORDER — LEVETIRACETAM 750 MG PO TABS: ORAL_TABLET | ORAL | 11 refills | Status: DC
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
## 2019-04-29 ENCOUNTER — Other Ambulatory Visit: Payer: Self-pay

## 2019-04-29 ENCOUNTER — Encounter: Payer: Self-pay | Admitting: Neurology

## 2019-04-30 ENCOUNTER — Telehealth: Payer: Self-pay | Admitting: Neurology

## 2019-04-30 NOTE — Telephone Encounter (Signed)
New Message  Patient calling the office for samples of medication:   1.  What medication and dosage are you requesting samples for? levetiracetam (Keppra) 750 mg tablet two tables in am and two in evening  2.  Are you currently out of this medication?  Patient verbalized he is and does not have the funds for medication listed above.

## 2019-04-30 NOTE — Telephone Encounter (Signed)
Left message that we do not have samples of his medication.

## 2019-05-06 ENCOUNTER — Telehealth: Payer: Self-pay | Admitting: Neurology

## 2019-05-06 NOTE — Telephone Encounter (Signed)
Patient left msg with after hours about wanting more samples. He did not give name of mediation.

## 2019-05-07 NOTE — Telephone Encounter (Signed)
~  Kyle Adams  See below Can you help with this contacting the Rep

## 2019-05-07 NOTE — Telephone Encounter (Signed)
Called spoke with patient spouse Kyle Adams she states that Aptiom 400 mg works for patient no side effects but cost is expensive with GoodRx card the cost is still $1000. She would like to know what is the next step.   Does this medication have a enrollment program for free samples?

## 2019-05-07 NOTE — Telephone Encounter (Signed)
Can you pls ask Kyle Adams for contact info for Aptiom rep and see how they can help him? Thanks

## 2019-05-07 NOTE — Telephone Encounter (Signed)
Patient verbalized wanting to speak to nurse in regards to samples and the copay card still brought cost of medication to $1,000.00.  Please f/u with patient

## 2019-05-08 MED ORDER — APTIOM 400 MG PO TABS: 600.0000 mg | ORAL_TABLET | Freq: Every day | ORAL | 0 refills | Status: DC

## 2019-05-08 NOTE — Telephone Encounter (Signed)
Patient left msg with after hours about needing some samples. Thanks!

## 2019-05-08 NOTE — Addendum Note (Signed)
Addended by: Ranae Plumber on: 05/08/2019 03:40 PM   Modules accepted: Orders

## 2019-05-08 NOTE — Telephone Encounter (Signed)
That is fine, thanks 

## 2019-05-08 NOTE — Telephone Encounter (Signed)
Pt states that he just received his medicaid card it will not be active until 24 hours from now. Requesting samples until he is able to use insurance. Pt say he has been out of medication for 2 days now and do not want to go much longer. Pt will be using public transportation to get to office to pick up samples.  Ok to for 1 box of samples

## 2019-05-08 NOTE — Telephone Encounter (Signed)
We dont not have 600 mg sample in office Verbal from Dr. Delice Lesch to have patient take 1 1/2 tablets of 400 MG daily until able to get Rx from pharmacy with insurance.

## 2019-05-08 NOTE — Telephone Encounter (Signed)
Ok to give samples until we hear back from Rep?

## 2019-05-08 NOTE — Addendum Note (Signed)
Addended by: Ranae Plumber on: 05/08/2019 03:09 PM   Modules accepted: Orders

## 2019-05-08 NOTE — Telephone Encounter (Signed)
[id number] LOT: [id number] EXP: 12/2020

## 2019-05-08 NOTE — Telephone Encounter (Signed)
Sample place at front desk for pick up
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
## 2019-09-21 ENCOUNTER — Encounter: Payer: Self-pay | Admitting: Neurology

## 2019-09-29 ENCOUNTER — Ambulatory Visit: Payer: Self-pay | Admitting: Neurology

## 2020-02-17 ENCOUNTER — Telehealth (INDEPENDENT_AMBULATORY_CARE_PROVIDER_SITE_OTHER): Payer: Medicaid Other | Admitting: Neurology

## 2020-02-17 ENCOUNTER — Other Ambulatory Visit: Payer: Self-pay

## 2020-02-17 DIAGNOSIS — Z5329 Procedure and treatment not carried out because of patient's decision for other reasons: Secondary | ICD-10-CM

## 2020-02-17 NOTE — Progress Notes (Signed)
Patient no-show for 02/17/2020 appointment.   Tried calling [phone], not working, several calls made with message: Call rejected. Sent email to [email or web address] and [email or web address] with no answer.

## 2020-05-03 ENCOUNTER — Other Ambulatory Visit: Payer: Self-pay | Admitting: Neurology

## 2020-05-07 ENCOUNTER — Other Ambulatory Visit: Payer: Self-pay | Admitting: Neurology

## 2020-05-10 ENCOUNTER — Telehealth: Payer: Self-pay | Admitting: Neurology

## 2020-05-10 MED ORDER — APTIOM 600 MG PO TABS: ORAL_TABLET | ORAL | 6 refills | Status: DC

## 2020-05-10 MED ORDER — LEVETIRACETAM 750 MG PO TABS: ORAL_TABLET | ORAL | 6 refills | Status: DC

## 2020-05-10 NOTE — Telephone Encounter (Signed)
Refills sent to pharmacy with note to pharmacy that pt must keep f/u appt (11/2020) for cont refills.

## 2020-05-10 NOTE — Telephone Encounter (Signed)
Patient needs Aptiom and Keppra refilled and [facility] to [facility] on [address]. He is scheduled for a follow up visit with Dr. Karel Jarvis, her first availability wasn't until Feb 2022 and he is on the waitlist to get in sooner.

## 2020-12-07 ENCOUNTER — Ambulatory Visit: Payer: Medicaid Other | Admitting: Neurology

## 2021-01-05 ENCOUNTER — Other Ambulatory Visit: Payer: Self-pay

## 2021-01-05 ENCOUNTER — Telehealth: Payer: Self-pay | Admitting: Neurology

## 2021-01-05 ENCOUNTER — Other Ambulatory Visit: Payer: Self-pay | Admitting: Neurology

## 2021-01-05 NOTE — Telephone Encounter (Signed)
Please advise, it has been over a year and he no showed for last visit. Please advise.

## 2021-01-05 NOTE — Telephone Encounter (Signed)
Patient is calling back to check on his medication refill please call

## 2021-01-05 NOTE — Telephone Encounter (Signed)
Patient called to see if Dr. Karel Jarvis will refill his Keppra to last until his appointment on 08/11/21. He only has two days of the medication left.  [facility] [address]

## 2021-01-06 ENCOUNTER — Other Ambulatory Visit: Payer: Self-pay

## 2021-01-06 MED ORDER — LEVETIRACETAM 750 MG PO TABS: ORAL_TABLET | ORAL | 1 refills | Status: DC

## 2021-01-06 NOTE — Telephone Encounter (Signed)
Ok to send refills until his f/u in October, thanks

## 2021-01-18 IMAGING — CT CT HEAD WITHOUT CONTRAST
3 series · 15 of 47 positions shown, 18 images · non-contrast
Comparison: July 23, 2017

CLINICAL DATA: Altered mental status with questionable seizure.

EXAM:
CT HEAD WITHOUT CONTRAST
TECHNIQUE: Contiguous axial images were obtained from the base of the skull
through the vertex without intravenous contrast.

[Series 2: head wo · axial · 0.44mm/px · z∈[-119,+11]mm · 9 of 32 slices shown, 12 images]
[im 3/32  brain]
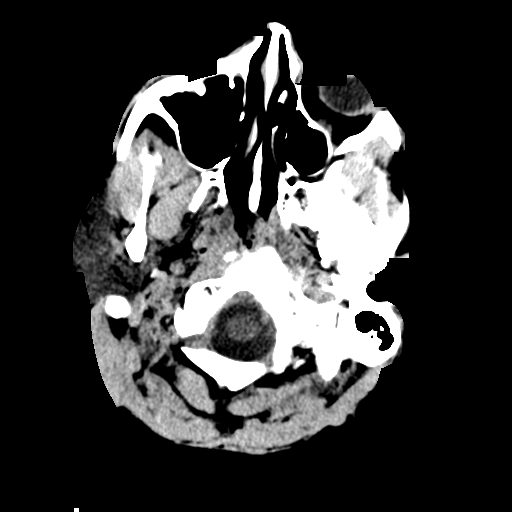
[im 3/32  bone]
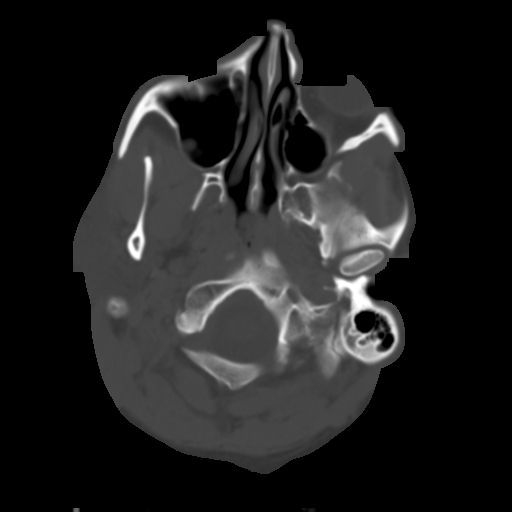
[im 6/32  brain]
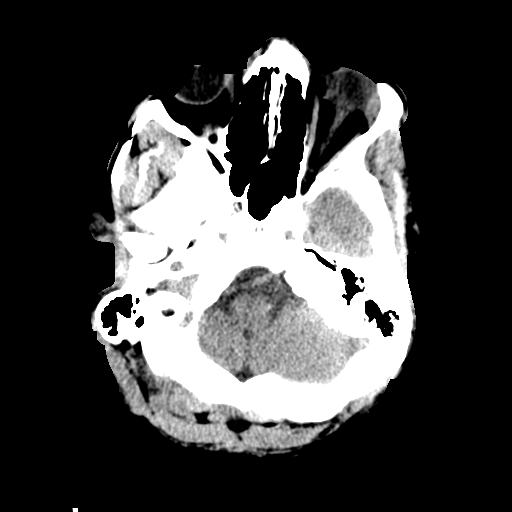
[im 9/32  brain]
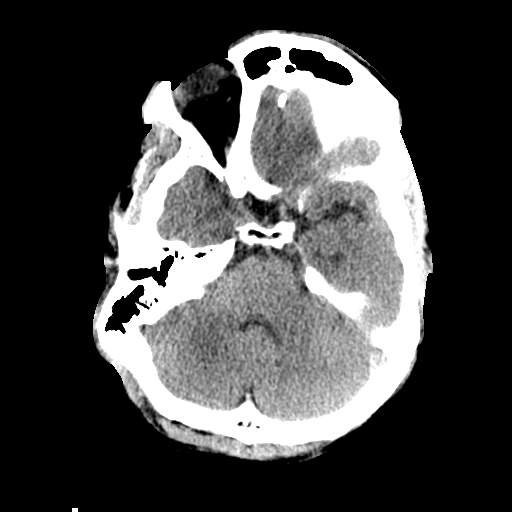
[im 12/32  brain]
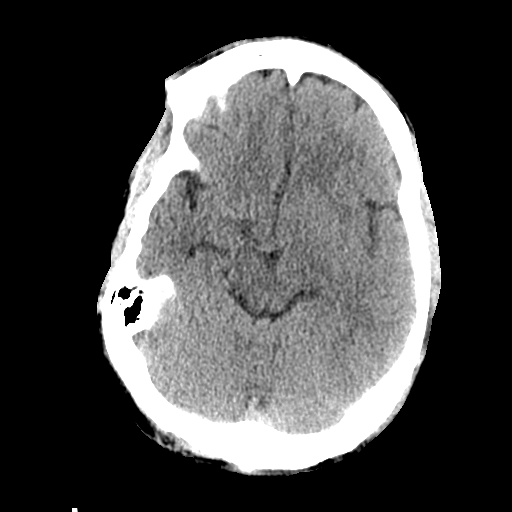
[im 17/32  brain]
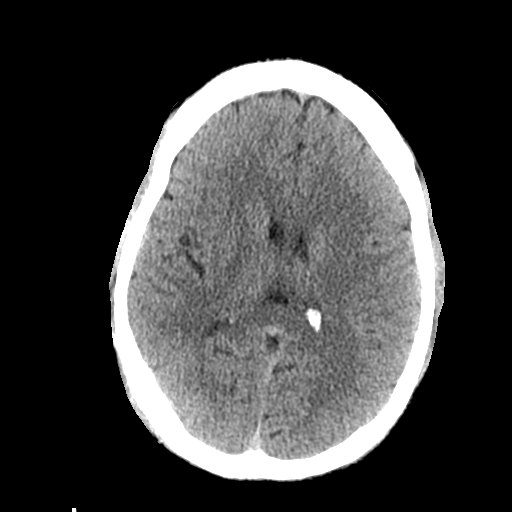
[im 17/32  bone]
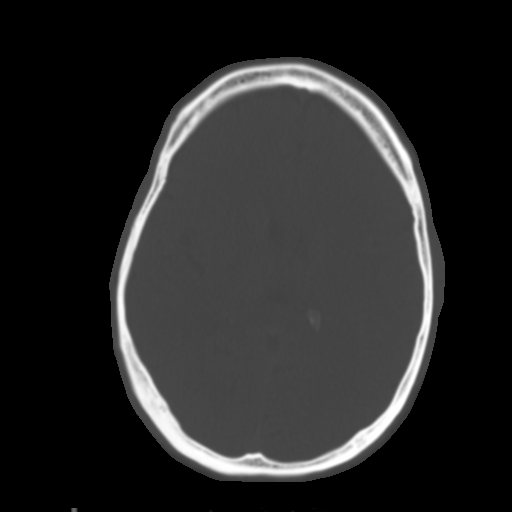
[im 20/32  brain]
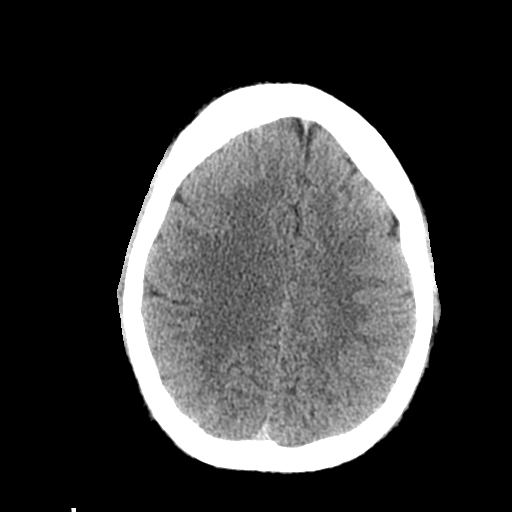
[im 23/32  brain]
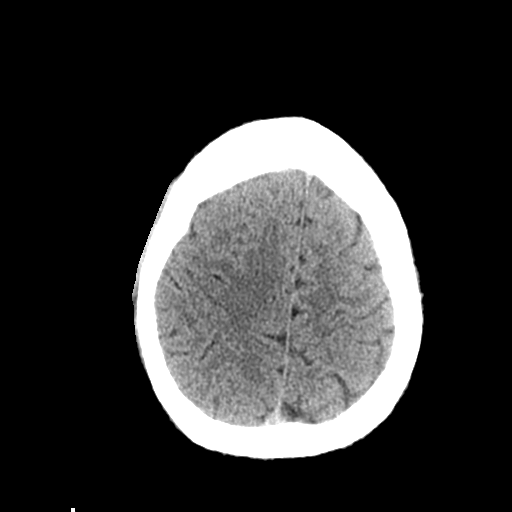
[im 26/32  brain]
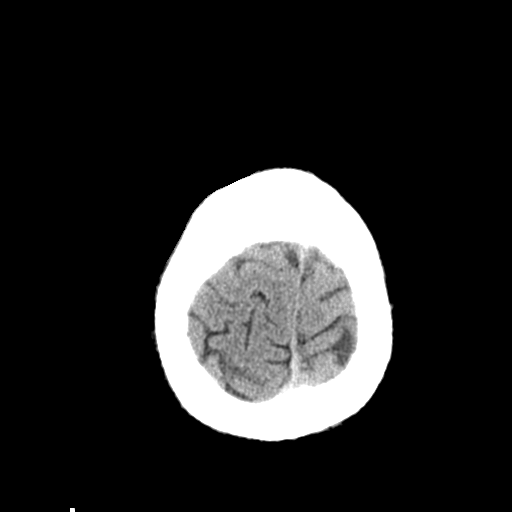
[im 29/32  brain]
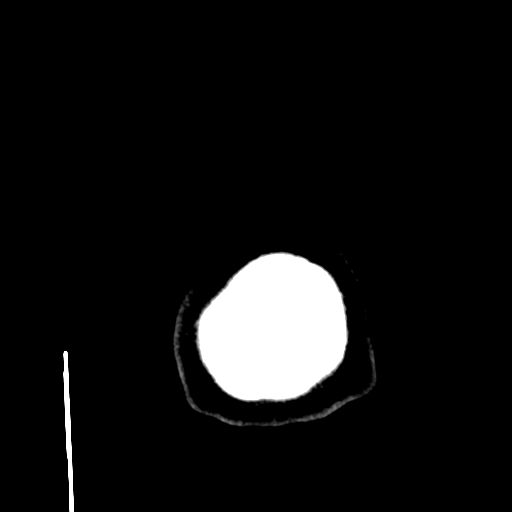
[im 29/32  bone]
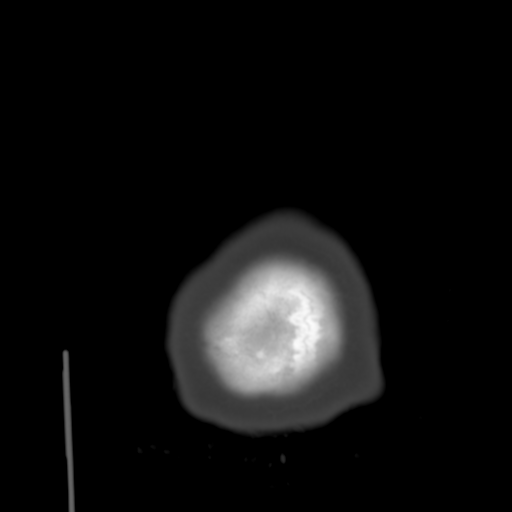

[Series 5: coronal soft tissue · coronal · 0.29mm/px · 3 of 67 slices shown]
[im 23/67  brain]
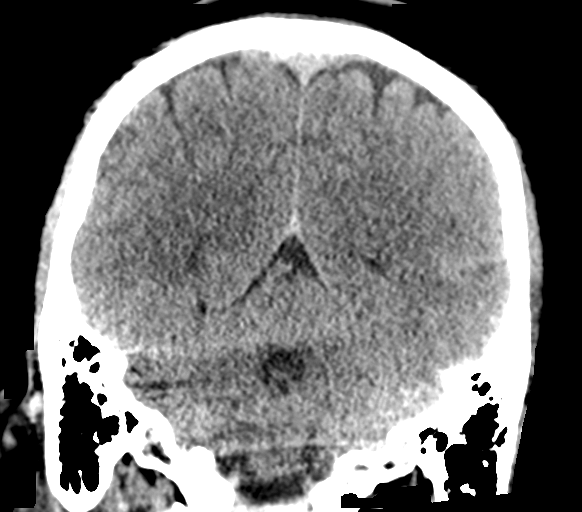
[im 30/67  brain]
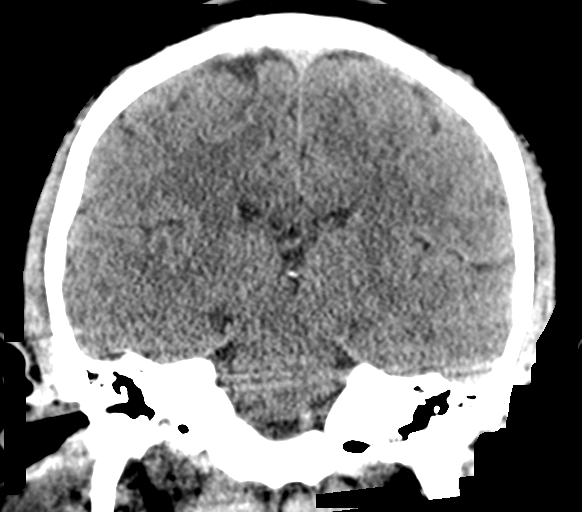
[im 37/67  brain]
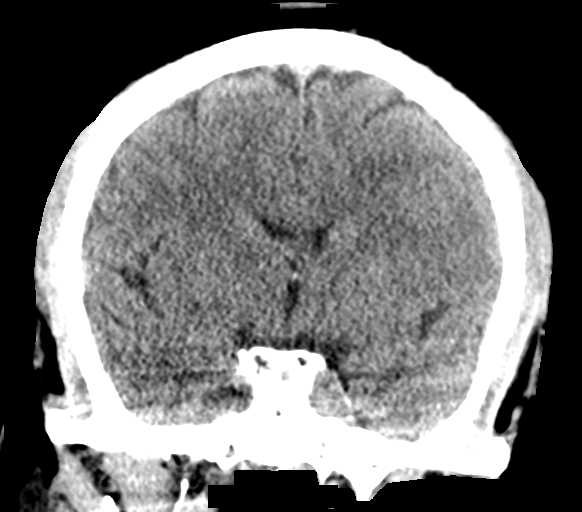

[Series 6: sagittal soft tissue · sagittal · 0.31mm/px · 3 of 54 slices shown]
[im 18/54  brain]
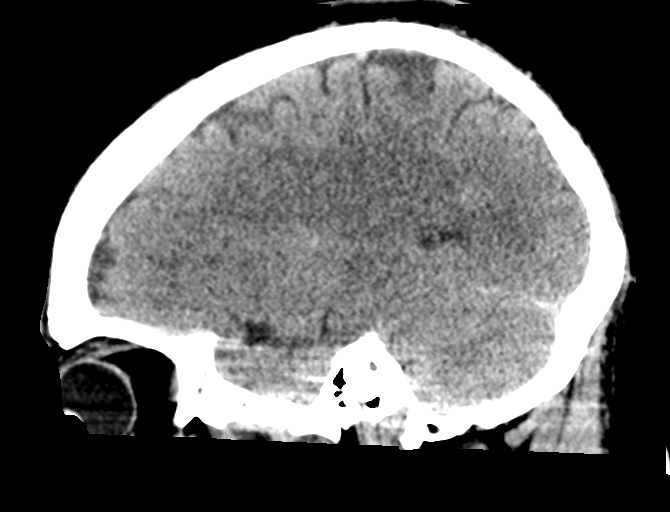
[im 27/54  brain]
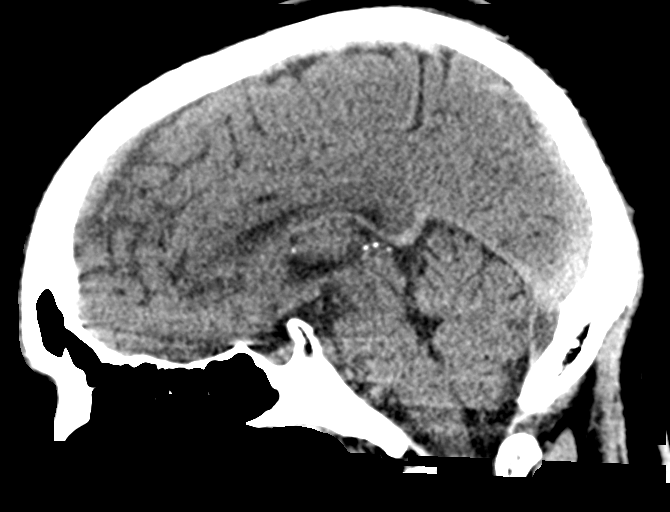
[im 36/54  brain]
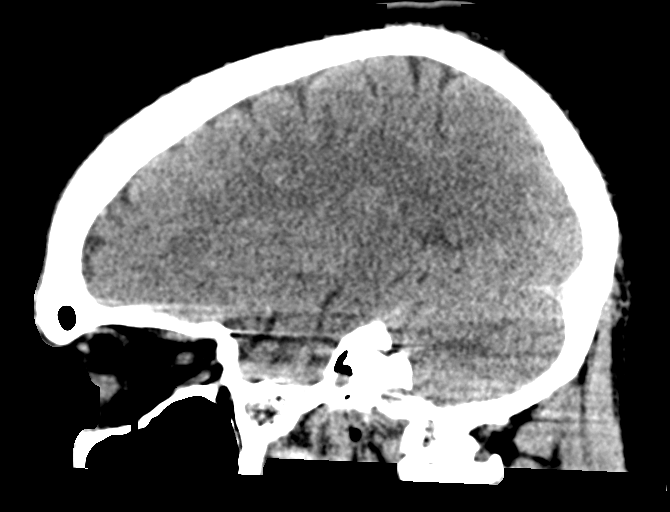

[15 of 47 positions shown; findings below may reference images not displayed]

FINDINGS: Brain: The ventricles are normal in size and configuration. There is
no intracranial mass, hemorrhage, extra-axial fluid collection, or
midline shift. Brain parenchyma appears unremarkable. No acute
infarct evident.

Vascular: There is no hyperdense vessel. There is no vascular
calcification.

Skull: The bony calvarium appears intact.

Sinuses/Orbits: There is a retention cyst in the posterior, superior
right maxillary antrum. There is slight opacification in the mid
left ethmoid air cell region. Orbits appear symmetric bilaterally.

Other: Visualized mastoid air cells are clear.
IMPRESSION: Mild paranasal sinus disease.  Study otherwise unremarkable.

## 2021-01-23 ENCOUNTER — Telehealth: Payer: Self-pay | Admitting: Neurology

## 2021-01-23 MED ORDER — APTIOM 600 MG PO TABS: ORAL_TABLET | ORAL | 6 refills | Status: DC

## 2021-01-23 NOTE — Telephone Encounter (Signed)
Patient requesting refills on his Aptiom to last until his next visit.  [facility] [address] and [address]

## 2021-01-23 NOTE — Telephone Encounter (Signed)
Refills sent until Oct, must keep f/u for further refills, thanks

## 2021-01-24 NOTE — Telephone Encounter (Signed)
Pt advised to keep his appt.

## 2021-03-22 ENCOUNTER — Other Ambulatory Visit: Payer: Self-pay

## 2021-03-22 ENCOUNTER — Ambulatory Visit (INDEPENDENT_AMBULATORY_CARE_PROVIDER_SITE_OTHER): Payer: Medicaid Other | Admitting: Neurology

## 2021-03-22 ENCOUNTER — Encounter: Payer: Self-pay | Admitting: Neurology

## 2021-03-22 ENCOUNTER — Other Ambulatory Visit (INDEPENDENT_AMBULATORY_CARE_PROVIDER_SITE_OTHER): Payer: Medicaid Other

## 2021-03-22 VITALS — BP 134/87 | HR 93 | Ht 70.0 in | Wt 229.8 lb

## 2021-03-22 DIAGNOSIS — G40309 Generalized idiopathic epilepsy and epileptic syndromes, not intractable, without status epilepticus: Secondary | ICD-10-CM | POA: Diagnosis not present

## 2021-03-22 DIAGNOSIS — G40009 Localization-related (focal) (partial) idiopathic epilepsy and epileptic syndromes with seizures of localized onset, not intractable, without status epilepticus: Secondary | ICD-10-CM

## 2021-03-22 LAB — CBC
HCT: 47.6 % (ref 39.0–52.0)
Hemoglobin: 16 g/dL (ref 13.0–17.0)
MCHC: 33.7 g/dL (ref 30.0–36.0)
MCV: 82.4 fl (ref 78.0–100.0)
Platelets: 227 10*3/uL (ref 150.0–400.0)
RBC: 5.78 Mil/uL (ref 4.22–5.81)
RDW: 13.6 % (ref 11.5–15.5)
WBC: 3.3 10*3/uL — ABNORMAL LOW (ref 4.0–10.5)

## 2021-03-22 LAB — COMPREHENSIVE METABOLIC PANEL
ALT: 20 U/L (ref 0–53)
AST: 20 U/L (ref 0–37)
Albumin: 4.5 g/dL (ref 3.5–5.2)
Alkaline Phosphatase: 53 U/L (ref 39–117)
BUN: 19 mg/dL (ref 6–23)
CO2: 24 mEq/L (ref 19–32)
Calcium: 9.3 mg/dL (ref 8.4–10.5)
Chloride: 107 mEq/L (ref 96–112)
Creatinine, Ser: 0.94 mg/dL (ref 0.40–1.50)
GFR: 108.83 mL/min (ref >60.00)
Glucose, Bld: 84 mg/dL (ref 70–99)
Potassium: 4 mEq/L (ref 3.5–5.1)
Sodium: 138 mEq/L (ref 135–145)
Total Bilirubin: 0.3 mg/dL (ref 0.2–1.2)
Total Protein: 7.4 g/dL (ref 6.0–8.3)

## 2021-03-22 MED ORDER — LEVETIRACETAM 750 MG PO TABS: ORAL_TABLET | ORAL | 11 refills | Status: DC

## 2021-03-22 MED ORDER — APTIOM 600 MG PO TABS: ORAL_TABLET | ORAL | 11 refills | Status: DC
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
## 2021-03-23 ENCOUNTER — Telehealth: Payer: Self-pay

## 2021-03-23 NOTE — Telephone Encounter (Signed)
-----   Message from Van Clines, MD sent at 03/23/2021 10:10 AM EDT ----- Pls let him know that bloodwork looks okay. Pls have him call [facility] or he can do it online and schedule appt with a new PCP, thanks

## 2021-03-23 NOTE — Telephone Encounter (Signed)
Pt called no answer voice mail left to call the office back  

## 2021-06-21 ENCOUNTER — Telehealth: Payer: Self-pay | Admitting: Neurology

## 2021-06-21 NOTE — Telephone Encounter (Signed)
Pt called and informed that insurance will not cover refill of his meds, pharmacy called to have medication refilled

## 2021-06-21 NOTE — Telephone Encounter (Signed)
Patient called and said he misplaced his Aptiom 600 MG that he just picked up yesterday.  Patient was in the process of moving, he said.  [facility] [address]

## 2021-06-22 ENCOUNTER — Telehealth: Payer: Self-pay | Admitting: Neurology

## 2021-06-22 NOTE — Telephone Encounter (Signed)
Patient called again and said he misplaced his Aptiom 600 MG that he just picked up.   He needs a duplicate prescription sent to the pharmacy below.   He will reach out to his insurance company, he said, and explain what happened.   [facility] [address]  Patient is out of the medication.

## 2021-06-22 NOTE — Telephone Encounter (Signed)
Pt called twice both times phone was hung up on

## 2021-06-23 NOTE — Telephone Encounter (Signed)
Ok to refill early but pls inform pharmacy so they can let him know on pickup that he needs to keep medication in safe place because we cannot refill again in future if this keeps happening. Thanks

## 2021-06-23 NOTE — Telephone Encounter (Signed)
Pt was called again and phone was hung up

## 2021-06-23 NOTE — Telephone Encounter (Signed)
Pt called twice both times phone was hung up if pt calls back we can give him 2 weeks of samples

## 2021-08-11 ENCOUNTER — Ambulatory Visit: Payer: Medicaid Other | Admitting: Neurology

## 2021-09-27 ENCOUNTER — Encounter: Payer: Self-pay | Admitting: Neurology

## 2021-09-27 ENCOUNTER — Other Ambulatory Visit: Payer: Self-pay

## 2021-09-27 ENCOUNTER — Ambulatory Visit: Payer: Medicaid Other | Admitting: Neurology

## 2021-09-27 DIAGNOSIS — G40009 Localization-related (focal) (partial) idiopathic epilepsy and epileptic syndromes with seizures of localized onset, not intractable, without status epilepticus: Secondary | ICD-10-CM

## 2021-09-27 MED ORDER — LEVETIRACETAM 750 MG PO TABS: ORAL_TABLET | ORAL | 11 refills | Status: DC

## 2021-09-27 MED ORDER — APTIOM 800 MG PO TABS: ORAL_TABLET | ORAL | 11 refills | Status: DC
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
## 2021-09-27 NOTE — Progress Notes (Signed)
Medication Samples have been provided to the patient.  Drug name: aptiom       Strength: 800 mg        Qty: 3  LOT:  [id number]  Exp.Date: 5/23  Dosing instructions: take one tablet daily  The patient has been instructed regarding the correct time, dose, and frequency of taking this medication, including desired effects and most common side effects.
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
## 2022-01-16 ENCOUNTER — Encounter: Payer: Self-pay | Admitting: Neurology

## 2022-01-16 ENCOUNTER — Telehealth (INDEPENDENT_AMBULATORY_CARE_PROVIDER_SITE_OTHER): Payer: Medicaid Other | Admitting: Neurology

## 2022-01-16 DIAGNOSIS — G40009 Localization-related (focal) (partial) idiopathic epilepsy and epileptic syndromes with seizures of localized onset, not intractable, without status epilepticus: Secondary | ICD-10-CM | POA: Diagnosis not present

## 2022-01-16 MED ORDER — APTIOM 800 MG PO TABS: ORAL_TABLET | ORAL | 3 refills | Status: DC

## 2022-01-16 MED ORDER — LEVETIRACETAM 750 MG PO TABS: ORAL_TABLET | ORAL | 3 refills | Status: DC
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
## 2022-01-16 NOTE — Patient Instructions (Signed)
Good to hear you are doing well. Continue Aptiom 800mg  every night and Levetiracetam 750mg : take 2 tablets twice a day. Follow-up in 6 months, call for any changes. ? ? ?Seizure Precautions: ?1. If medication has been prescribed for you to prevent seizures, take it exactly as directed.  Do not stop taking the medicine without talking to your doctor first, even if you have not had a seizure in a long time.  ? ?2. Avoid activities in which a seizure would cause danger to yourself or to others.  Don't operate dangerous machinery, swim alone, or climb in high or dangerous places, such as on ladders, roofs, or girders.  Do not drive unless your doctor says you may. ? ?3. If you have any warning that you may have a seizure, lay down in a safe place where you can't hurt yourself.   ? ?4.  No driving for 6 months from last seizure, as per [facility].   Please refer to the following link on the Epilepsy Foundation of America's website for more information: http://www.epilepsyfoundation.org/answerplace/Social/driving/drivingu.cfm  ? ?5.  Maintain good sleep hygiene. Avoid alcohol. ? ?6.  Contact your doctor if you have any problems that may be related to the medicine you are taking. ? ?7.  Call 911 and bring the patient back to the ED if: ?      ? A.  The seizure lasts longer than 5 minutes.      ? B.  The patient doesn't awaken shortly after the seizure ? C.  The patient has new problems such as difficulty seeing, speaking or moving ? D.  The patient was injured during the seizure ? E.  The patient has a temperature over 102 F (39C) ? F.  The patient vomited and now is having trouble breathing ?      ? ?

## 2022-08-06 ENCOUNTER — Encounter: Payer: Self-pay | Admitting: Neurology

## 2022-08-06 ENCOUNTER — Telehealth (INDEPENDENT_AMBULATORY_CARE_PROVIDER_SITE_OTHER): Payer: Medicaid Other | Admitting: Neurology

## 2022-08-06 VITALS — Ht 69.0 in | Wt 210.0 lb

## 2022-08-06 DIAGNOSIS — G40009 Localization-related (focal) (partial) idiopathic epilepsy and epileptic syndromes with seizures of localized onset, not intractable, without status epilepticus: Secondary | ICD-10-CM | POA: Diagnosis not present

## 2022-08-06 MED ORDER — LEVETIRACETAM 750 MG PO TABS: ORAL_TABLET | ORAL | 3 refills | Status: DC

## 2022-08-06 MED ORDER — APTIOM 800 MG PO TABS: ORAL_TABLET | ORAL | 3 refills | Status: DC
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
## 2022-08-06 NOTE — Patient Instructions (Signed)
Good to see you doing well. Continue all medications. Try to get as much rest/sleep as possible, avoid energy drinks. Follow-up in 6 months, call for any changes.    Seizure Precautions: 1. If medication has been prescribed for you to prevent seizures, take it exactly as directed.  Do not stop taking the medicine without talking to your doctor first, even if you have not had a seizure in a long time.   2. Avoid activities in which a seizure would cause danger to yourself or to others.  Don't operate dangerous machinery, swim alone, or climb in high or dangerous places, such as on ladders, roofs, or girders.  Do not drive unless your doctor says you may.  3. If you have any warning that you may have a seizure, lay down in a safe place where you can't hurt yourself.    4.  No driving for 6 months from last seizure, as per [facility].   Please refer to the following link on the [place] website for more information: http://www.epilepsyfoundation.org/answerplace/Social/driving/drivingu.cfm   5.  Maintain good sleep hygiene. Avoid alcohol.  6.  Contact your doctor if you have any problems that may be related to the medicine you are taking.  7.  Call 911 and bring the patient back to the ED if:        A.  The seizure lasts longer than 5 minutes.       B.  The patient doesn't awaken shortly after the seizure  C.  The patient has new problems such as difficulty seeing, speaking or moving  D.  The patient was injured during the seizure  E.  The patient has a temperature over 102 F (39C)  F.  The patient vomited and now is having trouble breathing

## 2023-01-03 ENCOUNTER — Telehealth: Payer: Self-pay | Admitting: Anesthesiology

## 2023-01-03 NOTE — Telephone Encounter (Signed)
Pt called stating he needs Dr Delice Lesch to write a letter for him where it states what his diagnosis is and what medications he takes for it. States he needs this for his medicaid.

## 2023-01-07 ENCOUNTER — Encounter: Payer: Self-pay | Admitting: Neurology

## 2023-01-07 NOTE — Telephone Encounter (Signed)
Done, thanks

## 2023-01-07 NOTE — Telephone Encounter (Signed)
Pt called informed that letter is ready ' 

## 2023-03-01 ENCOUNTER — Ambulatory Visit: Payer: Medicaid Other | Admitting: Neurology

## 2023-03-01 ENCOUNTER — Encounter: Payer: Self-pay | Admitting: Neurology

## 2023-03-01 DIAGNOSIS — G40009 Localization-related (focal) (partial) idiopathic epilepsy and epileptic syndromes with seizures of localized onset, not intractable, without status epilepticus: Secondary | ICD-10-CM | POA: Diagnosis not present

## 2023-03-01 MED ORDER — LEVETIRACETAM 750 MG PO TABS: ORAL_TABLET | ORAL | 3 refills | Status: DC

## 2023-03-01 MED ORDER — APTIOM 800 MG PO TABS: ORAL_TABLET | ORAL | 3 refills | Status: DC

## 2023-03-01 NOTE — Patient Instructions (Signed)
Good to see you. Continue Keppra 750mg : take 2 tablets twice a day and Aptiom 800mg : take 1 tablet every night   Please see you family doctor for the chest pain  Follow-up in 6 months, call for any changes   Seizure Precautions: 1. If medication has been prescribed for you to prevent seizures, take it exactly as directed.  Do not stop taking the medicine without talking to your doctor first, even if you have not had a seizure in a long time.   2. Avoid activities in which a seizure would cause danger to yourself or to others.  Don't operate dangerous machinery, swim alone, or climb in high or dangerous places, such as on ladders, roofs, or girders.  Do not drive unless your doctor says you may.  3. If you have any warning that you may have a seizure, lay down in a safe place where you can't hurt yourself.    4.  No driving for 6 months from last seizure, as per [facility].   Please refer to the following link on the Epilepsy Foundation of America's website for more information: http://www.epilepsyfoundation.org/answerplace/Social/driving/drivingu.cfm   5.  Maintain good sleep hygiene. Avoid alcohol.  6.  Contact your doctor if you have any problems that may be related to the medicine you are taking.  7.  Call 911 and bring the patient back to the ED if:        A.  The seizure lasts longer than 5 minutes.       B.  The patient doesn't awaken shortly after the seizure  C.  The patient has new problems such as difficulty seeing, speaking or moving  D.  The patient was injured during the seizure  E.  The patient has a temperature over 102 F (39C)  F.  The patient vomited and now is having trouble breathing
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
## 2023-04-30 ENCOUNTER — Other Ambulatory Visit: Payer: Self-pay

## 2023-04-30 ENCOUNTER — Emergency Department (HOSPITAL_COMMUNITY): Payer: Medicaid Other

## 2023-04-30 ENCOUNTER — Emergency Department (HOSPITAL_COMMUNITY)
Admission: EM | Admit: 2023-04-30 | Discharge: 2023-04-30 | Disposition: A | Discharge status: Home / Self Care | Payer: Medicaid Other | Attending: Emergency Medicine | Admitting: Emergency Medicine

## 2023-04-30 ENCOUNTER — Encounter (HOSPITAL_COMMUNITY): Payer: Self-pay

## 2023-04-30 DIAGNOSIS — S4992XA Unspecified injury of left shoulder and upper arm, initial encounter: Secondary | ICD-10-CM | POA: Insufficient documentation

## 2023-04-30 DIAGNOSIS — X500XXA Overexertion from strenuous movement or load, initial encounter: Secondary | ICD-10-CM | POA: Diagnosis not present

## 2023-04-30 MED ORDER — NAPROXEN 375 MG PO TABS: 375.0000 mg | ORAL_TABLET | Freq: Two times a day (BID) | ORAL | 0 refills | Status: AC

## 2023-04-30 NOTE — Discharge Instructions (Addendum)
The x-ray of your left shoulder did not show any acute fracture or dislocation.  You were given a prescription for anti-inflammatories to help with your pain, please take 1 tablet twice a day with food.  You were given the phone number to orthopedics, please schedule an appointment for further management.

## 2023-04-30 NOTE — Progress Notes (Signed)
Orthopedic Tech Progress Note Patient Details:  Kyle Adams 23-Apr-1991 [id number]  Ortho Devices Type of Ortho Device: Sling immobilizer Ortho Device/Splint Location: LUE Ortho Device/Splint Interventions: Ordered, Application, Adjustment   Post Interventions Patient Tolerated: Well Instructions Provided: Care of device  Donald Pore 04/30/2023, 5:40 PM

## 2023-04-30 NOTE — ED Provider Triage Note (Signed)
Emergency Medicine Provider Triage Evaluation Note  Kyle Adams , a 32 y.o. male  was evaluated in triage.  Pt complains of Left shoulder pain. Injured 3 days ago worse with mvmvt.  Review of Systems  Positive: Left arm pain  Negative: Weakness   Physical Exam  BP (!) 132/92   Pulse 74   Temp 98.1 F (36.7 C) (Oral)   Resp 16   SpO2 94%  Gen:   Awake, no distress   Resp:  Normal effort  MSK:   Moves extremities without difficulty  Other:  Ttp ant L: shoulder  Medical Decision Making  Medically screening exam initiated at 4:00 PM.  Appropriate orders placed.  Caedyn D Sprung was informed that the remainder of the evaluation will be completed by another provider, this initial triage assessment does not replace that evaluation, and the importance of remaining in the ED until their evaluation is complete.      Arthor Captain, PA-C 04/30/23 [id number]

## 2023-04-30 NOTE — ED Triage Notes (Signed)
Pt helping friend do ceiling work on sunday, lifting heavy, felt pop in L shoulder ; last night moving cases of water, re injured L shoulder; swollen and tender, limited movement; using ice and icy hot at home with some relief

## 2023-04-30 NOTE — ED Provider Notes (Signed)
[facility] AT [facility] Provider Note   CSN: [id number] Arrival date & time: 04/30/23  1538     History  No chief complaint on file.   Kyle Adams is a 32 y.o. male.  32 y.o male with no PMH presents to the ED with a chief complaint of left injury x 2 days.  Patient reports he heard his shoulder approximately 2 days ago placing a beam while standing on the ladder and overhead reaching.  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  He has not taken any medication for improvement in symptoms.  Describes as a pinching sensation every time he overextends his left arm overhead.  There is pain with overhead reaching however no numbness, tingling noted.  Denies any chest pain, shortness of breath, fever.  The history is provided by the patient.       Home Medications Prior to Admission medications   Medication Sig Start Date End Date Taking? Authorizing Provider  naproxen (NAPROSYN) 375 MG tablet Take 1 tablet (375 mg total) by mouth 2 (two) times daily for 7 days. 04/30/23 05/07/23 Yes Adleigh Mcmasters, Leonie Douglas, PA-C  Eslicarbazepine Acetate (APTIOM) 800 MG TABS Take 1 tablet every night 03/01/23   Van Clines, MD  fluticasone [facility]) 50 MCG/ACT nasal spray Place into both nostrils. Patient not taking: Reported on 03/01/2023 07/24/22   [provider]  levETIRAcetam (KEPPRA) 750 MG tablet Take 2 tabs in am , 2 tablets in evening 03/01/23   Van Clines, MD  VENTOLIN HFA 108 [phone] Base) MCG/ACT inhaler Inhale into the lungs. 06/29/22   [provider]      Allergies    Amoxicillin and Zyrtec [cetirizine]    Review of Systems   Review of Systems  Constitutional:  Negative for chills and fever.  Musculoskeletal:  Positive for arthralgias.    Physical Exam Updated Vital Signs BP (!) 132/92   Pulse 74   Temp 98.1 F (36.7 C) (Oral)   Resp 16   SpO2 94%  Physical Exam Vitals and nursing  note reviewed.  Constitutional:      Appearance: Normal appearance.  HENT:     Head: Normocephalic and atraumatic.     Mouth/Throat:     Mouth: Mucous membranes are moist.  Cardiovascular:     Rate and Rhythm: Normal rate.  Pulmonary:     Effort: Pulmonary effort is normal.  Abdominal:     General: Abdomen is flat.  Musculoskeletal:     Left shoulder: Tenderness present. No swelling, deformity or bony tenderness. Normal strength. Normal pulse.     Cervical back: Normal range of motion and neck supple.     Comments: 2+ radial pulse, sensation is intact. Full ROM with pain, worsen with overhead extension. No swelling, no erythema, no weakness.   Skin:    General: Skin is warm and dry.  Neurological:     Mental Status: He is alert and oriented to person, place, and time.     ED Results / Procedures / Treatments   Labs (all labs ordered are listed, but only abnormal results are displayed) Labs Reviewed - No data to display  EKG None  Radiology DG Shoulder Left  Result Date: 04/30/2023 CLINICAL DATA:  Left shoulder injury. Swelling and tenderness after lifting injury with a pop. Limited movement. EXAM: LEFT SHOULDER - 2+ VIEW COMPARISON:  None Available. FINDINGS: There is no evidence of fracture or dislocation. There  is no evidence of arthropathy or other focal bone abnormality. Soft tissues are unremarkable. IMPRESSION: No acute bony abnormalities. Electronically Signed   By: Burman Nieves M.D.   On: 04/30/2023 16:44    Procedures Procedures    Medications Ordered in ED Medications - No data to display  ED Course/ Medical Decision Making/ A&P                             Medical Decision Making Risk Prescription drug management.   Patient presents to the ED with a chief complaint of left shoulder injury which occurred 2 days ago.  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  Reports feeling a popping sensation and now has pain with  overhead reach.  On evaluation he has full range of motion with pain, no erythema, no signs of dislocation noted.  Has good strength bilaterally and is neurovascularly intact.  X-ray of his left shoulder did not show any fracture or dislocation.  We discussed supportive treatment for likely ligamentous injury, will place him on a sling to help with comfort and rest, will go home on a short course of anti-inflammatories and appropriate orthopedic follow-up.  He is hemodynamically stable for discharge.   Portions of this note were generated with Scientist, clinical (histocompatibility and immunogenetics). Dictation errors may occur despite best attempts at proofreading.   Final Clinical Impression(s) / ED Diagnoses Final diagnoses:  Injury of left shoulder, initial encounter    Rx / DC Orders ED Discharge Orders          Ordered    naproxen (NAPROSYN) 375 MG tablet  2 times daily        04/30/23 1724              Claude Manges, PA-C 04/30/23 1732    Rondel Baton, MD 05/01/23 1409

## 2023-07-16 ENCOUNTER — Telehealth: Payer: Self-pay | Admitting: Neurology

## 2023-07-16 DIAGNOSIS — G40009 Localization-related (focal) (partial) idiopathic epilepsy and epileptic syndromes with seizures of localized onset, not intractable, without status epilepticus: Secondary | ICD-10-CM

## 2023-07-16 NOTE — Telephone Encounter (Signed)
Called patients number not operating

## 2023-07-16 NOTE — Telephone Encounter (Signed)
Called pateint unable to reach on either number. If patient calls back please send through. Patient needs to go to ED we do not have samples and patient needs to go to ED to get med levels checked and get  IV medication

## 2023-07-16 NOTE — Telephone Encounter (Signed)
atient is out of his medication and his medicaid is lasped right now , he is in the process of getting it fixed. but he is out of him LevEITRAcetam 500mg  and he has had multiple seizures today since he is out and he is wanting samples

## 2023-07-16 NOTE — Telephone Encounter (Signed)
Patient is having freq. Seizures. He is needing medication. His medicaid is having issues with going through due to the social Investment banker, operational . Please give patient a call back

## 2023-07-17 ENCOUNTER — Encounter: Payer: Self-pay | Admitting: Neurology

## 2023-07-17 MED ORDER — LEVETIRACETAM 750 MG PO TABS: ORAL_TABLET | ORAL | 3 refills | Status: DC

## 2023-07-17 NOTE — Telephone Encounter (Signed)
Both number called both numbers not working I tried to call [facility] to see If we can get his medication at a discounted price he has no profile their they are not able to run it because of HIPAA. Pt has mychart I will send him a message,

## 2023-07-17 NOTE — Telephone Encounter (Signed)
Letter mail to pt

## 2023-07-17 NOTE — Telephone Encounter (Signed)
Pls mail letter to Patient, thanks

## 2023-07-18 ENCOUNTER — Other Ambulatory Visit: Payer: Self-pay | Admitting: Neurology

## 2023-07-19 ENCOUNTER — Other Ambulatory Visit (HOSPITAL_COMMUNITY): Payer: Self-pay

## 2023-07-19 NOTE — Telephone Encounter (Signed)
PA is not needed for this medication

## 2023-07-22 ENCOUNTER — Telehealth: Payer: Self-pay | Admitting: Neurology

## 2023-07-22 MED ORDER — APTIOM 800 MG PO TABS: ORAL_TABLET | ORAL | 3 refills | Status: DC

## 2023-07-22 NOTE — Addendum Note (Signed)
Addended by: Van Clines on: 07/22/2023 10:46 AM   Modules accepted: Orders

## 2023-07-22 NOTE — Telephone Encounter (Signed)
Pt called and both contact numbers called no answer pt and pt wife numbers not working pt grandmother no voicemail set up

## 2023-07-22 NOTE — Telephone Encounter (Signed)
If pt calls back PLEASE Look at number he calls from

## 2023-07-22 NOTE — Telephone Encounter (Signed)
Caller stated he took a store brand tylenol last week, "Equate pink coated pill" that caused him to have a seizure and he would to speak with nurse or doctor to find out if he could possibly be allergic to it

## 2023-07-23 NOTE — Telephone Encounter (Signed)
Pt called phone number is not working.

## 2023-07-25 NOTE — Telephone Encounter (Signed)
My chart message sent to pt. Pt number not working pt grandmother called she did not answer the phone,

## 2023-09-26 ENCOUNTER — Encounter: Payer: Self-pay | Admitting: Neurology

## 2023-09-26 ENCOUNTER — Telehealth: Payer: Medicaid Other | Admitting: Neurology

## 2023-09-26 DIAGNOSIS — G40009 Localization-related (focal) (partial) idiopathic epilepsy and epileptic syndromes with seizures of localized onset, not intractable, without status epilepticus: Secondary | ICD-10-CM

## 2023-09-26 MED ORDER — VALTOCO 20 MG DOSE 10 MG/0.1ML NA LQPK: NASAL | 5 refills | Status: DC

## 2023-09-26 MED ORDER — LEVETIRACETAM 750 MG PO TABS: ORAL_TABLET | ORAL | 3 refills | Status: DC

## 2023-09-26 MED ORDER — ESLICARBAZEPINE ACETATE 600 MG PO TABS: ORAL_TABLET | ORAL | 3 refills | Status: DC
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
## 2023-11-12 ENCOUNTER — Telehealth: Payer: Self-pay | Admitting: Neurology

## 2023-11-12 DIAGNOSIS — G40009 Localization-related (focal) (partial) idiopathic epilepsy and epileptic syndromes with seizures of localized onset, not intractable, without status epilepticus: Secondary | ICD-10-CM

## 2023-11-12 NOTE — Telephone Encounter (Signed)
Pt. Called as Rx normally filled for 3 mnths. Pharmacy only filled Rx fpr 1 month and Pt is out of Meds, needs ASAP.

## 2023-11-13 MED ORDER — LEVETIRACETAM 750 MG PO TABS: ORAL_TABLET | ORAL | 0 refills | Status: DC

## 2023-11-13 NOTE — Telephone Encounter (Signed)
Patient called again and LM with the AN. He is out of his keppra. 750mg  2 tablets twice a day.  [facility] [address]

## 2023-11-13 NOTE — Telephone Encounter (Signed)
Medication was sent in for the pt this morning

## 2023-11-27 ENCOUNTER — Telehealth: Payer: Self-pay | Admitting: Neurology

## 2023-11-27 NOTE — Telephone Encounter (Signed)
Pt called about his seizures no answer left a voice mail to call the office back, pt wife number called it was not working  Pt c/o: seizure Missed medications?   Sleep deprived?   Alcohol intake?   Increased stress?  Any triggers? Any change in medication color or shape?  Back to their usual baseline self?  . If no, advise go to ER Current medications prescribed by Dr. Karel Jarvis:   diazePAM, 20 MG Dose, (VALTOCO 20 MG DOSE) 2 x 10 MG/0.1ML LQPK Administer one spray in one nostril, second spray in other nostril (one dose) as needed for seizure. May use second dose after 4 hours if needed, Eslicarbazepine Acetate 600 MG TABS (Aptiom)  Take 2 tablets every night,  levETIRAcetam (KEPPRA) 750 MG tablet Take 2 tabs in am , 2 tablets in evening,

## 2023-11-27 NOTE — Telephone Encounter (Signed)
Kyle Adams went up on dosage Aptiom due to increased seizures. The past two weeks he is still having seizures. He had 5 yesterday, His wife needs to know what she can do. Its causing issues with her job due to his increased seizures. Needs a call back ASAP

## 2023-11-29 MED ORDER — CLOBAZAM 10 MG PO TABS: ORAL_TABLET | ORAL | 5 refills | Status: DC

## 2023-11-29 NOTE — Telephone Encounter (Signed)
Pt called back because he seen that Dr Karel Jarvis had called in a new RX for him pt was advised that Dr Karel Jarvis called in a prescription for a new medication Clobazam 10mg : take 1 tablet every night. Main side effect would be drowsiness. Continue all his other medications. Pt stated "NAH I aint doin that I dont want no medication that will cause drowsiness." Pt advised that he will be taken it at bed time when he goes to sleep.He was then advised that Dr Karel Jarvis wanted him to have a 1 hour EEG he stated "I aint getting electrocuted" pt advised that an EEG will not electrocute him that it is a test that we do to monitor brain waves to see if he is having seizures. PT then asked "WILL THIS HELP ME GET A CHECK'. He was advised that No its not going to help him get a check he said getting a check is all he is worried about.

## 2023-11-29 NOTE — Telephone Encounter (Signed)
Pls let wife know I sent in a prescription for a new medication Clobazam 10mg : take 1 tablet every night. Main side effect would be drowsiness. Continue all his other medications. I would also like to repeat an EEG, pls order 1-hour EEG, thanks

## 2023-12-03 NOTE — Telephone Encounter (Signed)
Noted, will discuss on f/u. thanks

## 2023-12-11 ENCOUNTER — Telehealth: Payer: Self-pay | Admitting: Neurology

## 2023-12-11 DIAGNOSIS — G40009 Localization-related (focal) (partial) idiopathic epilepsy and epileptic syndromes with seizures of localized onset, not intractable, without status epilepticus: Secondary | ICD-10-CM

## 2023-12-11 MED ORDER — LEVETIRACETAM 750 MG PO TABS: ORAL_TABLET | ORAL | 0 refills | Status: DC

## 2023-12-11 NOTE — Telephone Encounter (Signed)
 Pt wife called informed that RX was sent in for them , 30 day sent to last until his appointment then we will send in 90days with refill,

## 2023-12-11 NOTE — Telephone Encounter (Signed)
 1. Which medications need refilled? (List name and dosage, if known) Keppra  2. Which pharmacy/location is medication to be sent to? (include street and city if local pharmacy) [facility] [address]  3. Do they need a 30 day or 90 day supply? Would like a 90 day supply - pt says they have been recently only giving him a 30 day supply and then he has a hard time getting it every month

## 2023-12-23 ENCOUNTER — Other Ambulatory Visit: Payer: Self-pay

## 2023-12-23 ENCOUNTER — Telehealth: Payer: Self-pay | Admitting: Neurology

## 2023-12-23 DIAGNOSIS — G40009 Localization-related (focal) (partial) idiopathic epilepsy and epileptic syndromes with seizures of localized onset, not intractable, without status epilepticus: Secondary | ICD-10-CM

## 2023-12-23 MED ORDER — LEVETIRACETAM 750 MG PO TABS: ORAL_TABLET | ORAL | 0 refills | Status: DC

## 2023-12-23 NOTE — Telephone Encounter (Signed)
 1. Which medications need refilled? (List name and dosage, if known) Keppra - he thought he was supposed to get enough to get him to his next appointment, but he is out as of today.  2. Which pharmacy/location is medication to be sent to? (include street and city if local pharmacy) [facility] [address]

## 2023-12-23 NOTE — Telephone Encounter (Signed)
Pt called no answer left a voice mail to call office back  °

## 2023-12-23 NOTE — Telephone Encounter (Signed)
 Pt pharmacy called new RX sent talked to the pharmacy told them they have been messing with his insurance doing partal fills and they need to fill the RX for him he is out of medication. They are filling it, pt wife called she was thankful they will pick up his medication

## 2023-12-26 ENCOUNTER — Other Ambulatory Visit: Payer: Self-pay | Admitting: Neurology

## 2023-12-26 DIAGNOSIS — G40009 Localization-related (focal) (partial) idiopathic epilepsy and epileptic syndromes with seizures of localized onset, not intractable, without status epilepticus: Secondary | ICD-10-CM

## 2023-12-30 ENCOUNTER — Telehealth (INDEPENDENT_AMBULATORY_CARE_PROVIDER_SITE_OTHER): Payer: Medicaid Other | Admitting: Neurology

## 2023-12-30 ENCOUNTER — Encounter: Payer: Self-pay | Admitting: Neurology

## 2023-12-30 ENCOUNTER — Other Ambulatory Visit (HOSPITAL_COMMUNITY): Payer: Self-pay

## 2023-12-30 DIAGNOSIS — G40009 Localization-related (focal) (partial) idiopathic epilepsy and epileptic syndromes with seizures of localized onset, not intractable, without status epilepticus: Secondary | ICD-10-CM

## 2023-12-30 MED ORDER — CLOBAZAM 10 MG PO TABS
ORAL_TABLET | ORAL | 3 refills | Status: DC
  Filled 2023-12-30 – 2024-03-27 (×2): qty 30, 30d supply, fill #0

## 2023-12-30 MED ORDER — LEVETIRACETAM 750 MG PO TABS
ORAL_TABLET | ORAL | 3 refills | Status: DC
  Filled 2023-12-30: qty 360, fill #0
  Filled 2024-01-02: qty 360, 90d supply, fill #0
  Filled 2024-03-27: qty 120, 30d supply, fill #0
  Filled 2024-04-30: qty 12, 3d supply, fill #1
  Filled 2024-04-30: qty 108, 27d supply, fill #1
  Filled 2024-06-02 (×2): qty 120, 30d supply, fill #2
  Filled 2024-07-03 (×3): qty 120, 30d supply, fill #3

## 2023-12-30 MED ORDER — VALTOCO 20 MG DOSE 2 X 10 MG/0.1ML NA LQPK
NASAL | 5 refills | Status: DC
  Filled 2023-12-30: qty 5, fill #0
  Filled 2024-03-27: qty 5, 3d supply, fill #0

## 2023-12-30 MED ORDER — ESLICARBAZEPINE ACETATE 600 MG PO TABS
2.0000 | ORAL_TABLET | Freq: Every evening | ORAL | 3 refills | Status: DC
  Filled 2023-12-30: qty 180, 90d supply, fill #0
  Filled 2024-03-27: qty 60, 30d supply, fill #0
  Filled 2024-04-23: qty 180, 90d supply, fill #1
  Filled 2024-05-12: qty 60, 30d supply, fill #1
  Filled 2024-06-16 (×2): qty 60, 30d supply, fill #2
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
## 2023-12-30 NOTE — Patient Instructions (Signed)
 Good to see you.  Start taking the Clobazam 10mg  at dinner time (earlier in the evening) and see if this helps with the morning drowsiness  2. Continue all your other medications. Refills also sent for rescue Valtoco nasal spray  3. Follow-up in 3 months, call for any changes.    Seizure Precautions: 1. If medication has been prescribed for you to prevent seizures, take it exactly as directed.  Do not stop taking the medicine without talking to your doctor first, even if you have not had a seizure in a long time.   2. Avoid activities in which a seizure would cause danger to yourself or to others.  Don't operate dangerous machinery, swim alone, or climb in high or dangerous places, such as on ladders, roofs, or girders.  Do not drive unless your doctor says you may.  3. If you have any warning that you may have a seizure, lay down in a safe place where you can't hurt yourself.    4.  No driving for 6 months from last seizure, as per [facility].   Please refer to the following link on the Epilepsy Foundation of America's website for more information: http://www.epilepsyfoundation.org/answerplace/Social/driving/drivingu.cfm   5.  Maintain good sleep hygiene. Avoid alcohol.  6.  Contact your doctor if you have any problems that may be related to the medicine you are taking.  7.  Call 911 and bring the patient back to the ED if:        A.  The seizure lasts longer than 5 minutes.       B.  The patient doesn't awaken shortly after the seizure  C.  The patient has new problems such as difficulty seeing, speaking or moving  D.  The patient was injured during the seizure  E.  The patient has a temperature over 102 F (39C)  F.  The patient vomited and now is having trouble breathing

## 2023-12-31 ENCOUNTER — Other Ambulatory Visit: Payer: Self-pay

## 2023-12-31 ENCOUNTER — Other Ambulatory Visit (HOSPITAL_COMMUNITY): Payer: Self-pay

## 2023-12-31 ENCOUNTER — Encounter: Payer: Self-pay | Admitting: Pharmacist

## 2024-01-02 ENCOUNTER — Other Ambulatory Visit (HOSPITAL_COMMUNITY): Payer: Self-pay

## 2024-01-06 ENCOUNTER — Other Ambulatory Visit (HOSPITAL_COMMUNITY): Payer: Self-pay

## 2024-01-08 ENCOUNTER — Other Ambulatory Visit: Payer: Self-pay

## 2024-01-13 ENCOUNTER — Other Ambulatory Visit: Payer: Self-pay

## 2024-01-27 ENCOUNTER — Other Ambulatory Visit (HOSPITAL_COMMUNITY): Payer: Self-pay

## 2024-03-25 ENCOUNTER — Telehealth: Payer: Self-pay

## 2024-03-25 NOTE — Telephone Encounter (Signed)
 Pt needs PA for his eslicarbazepine acetate 

## 2024-03-27 ENCOUNTER — Other Ambulatory Visit (HOSPITAL_COMMUNITY): Payer: Self-pay

## 2024-03-27 ENCOUNTER — Telehealth: Payer: Self-pay

## 2024-03-27 ENCOUNTER — Telehealth: Payer: Self-pay | Admitting: Neurology

## 2024-03-27 NOTE — Telephone Encounter (Signed)
 Pt called and LM with AN. He needs a sooner appt ( his appt is 03/30/24 ) he was given a 3 mo supply and can't get it filled. He ran out of medication 3 days ago. [facility]'t [facility]. He feels nauseous and lightheaded

## 2024-03-27 NOTE — Telephone Encounter (Signed)
 Spoke with pt he has not gotten his medication from either pharmacy calling [facility] to see what is going on with his RX   Pt was given samples of aptiom   Medication Samples have been provided to the patient.  Drug name: Aptiom        Strength: 600mg         Qty: 4  LOT: [id number]  Exp.Date: 05/2025  Dosing instructions: Take 2 tablets by mouth nightly   The patient has been instructed regarding the correct time, dose, and frequency of taking this medication, including desired effects and most common side effects.

## 2024-03-27 NOTE — Telephone Encounter (Signed)
 Pharmacy Patient Advocate Encounter   Received notification from Pt Calls Messages that prior authorization for Eslicarbazepine Acetate  800MG  tablets is required/requested.   Insurance verification completed.   The patient is insured through [facility].   Per test claim: The current 30 day co-pay is, $4.00 **.  No PA needed at this time. This test claim was processed through [facility]- copay amounts may vary at other pharmacies due to pharmacy/plan contracts, or as the patient moves through the different stages of their insurance plan.     **Must be run for [facility] NAME

## 2024-03-28 ENCOUNTER — Other Ambulatory Visit (HOSPITAL_COMMUNITY): Payer: Self-pay

## 2024-03-30 ENCOUNTER — Encounter: Payer: Self-pay | Admitting: Neurology

## 2024-03-30 ENCOUNTER — Ambulatory Visit: Admitting: Neurology

## 2024-03-30 ENCOUNTER — Other Ambulatory Visit (HOSPITAL_COMMUNITY): Payer: Self-pay

## 2024-03-30 VITALS — BP 103/71 | HR 80 | Ht 69.0 in | Wt 174.6 lb

## 2024-03-30 DIAGNOSIS — G5622 Lesion of ulnar nerve, left upper limb: Secondary | ICD-10-CM | POA: Diagnosis not present

## 2024-03-30 DIAGNOSIS — G40009 Localization-related (focal) (partial) idiopathic epilepsy and epileptic syndromes with seizures of localized onset, not intractable, without status epilepticus: Secondary | ICD-10-CM | POA: Diagnosis not present

## 2024-03-30 MED ORDER — CLOBAZAM 10 MG PO TABS
ORAL_TABLET | ORAL | 3 refills | Status: DC
  Filled 2024-03-30: qty 60, 30d supply, fill #0
  Filled 2024-03-30: qty 180, fill #0
  Filled 2024-05-19: qty 60, 30d supply, fill #1

## 2024-03-30 MED ORDER — VALTOCO 20 MG DOSE 2 X 10 MG/0.1ML NA LQPK
NASAL | 5 refills | Status: AC
  Filled 2024-03-30: qty 10, 1d supply, fill #0
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
## 2024-03-30 NOTE — Patient Instructions (Addendum)
 Good to see you.  Increase Clobazam  10mg : take 2 tablets every night. Continue Keppra  750mg : 2 tablets twice a day and Aptiom  600mg : 2 tablets every night  2. Start using an elbow splint brace to help protect the nerve on the left elbow  3. Follow-up in 3 months, call for any changes   Seizure Precautions: 1. If medication has been prescribed for you to prevent seizures, take it exactly as directed.  Do not stop taking the medicine without talking to your doctor first, even if you have not had a seizure in a long time.   2. Avoid activities in which a seizure would cause danger to yourself or to others.  Don't operate dangerous machinery, swim alone, or climb in high or dangerous places, such as on ladders, roofs, or girders.  Do not drive unless your doctor says you may.  3. If you have any warning that you may have a seizure, lay down in a safe place where you can't hurt yourself.    4.  No driving for 6 months from last seizure, as per [place]  state law.   Please refer to the following link on the Epilepsy Foundation of America's website for more information: http://www.epilepsyfoundation.org/answerplace/Social/driving/drivingu.cfm   5.  Maintain good sleep hygiene. Avoid alcohol.  6.  Contact your doctor if you have any problems that may be related to the medicine you are taking.  7.  Call 911 and bring the patient back to the ED if:        A.  The seizure lasts longer than 5 minutes.       B.  The patient doesn't awaken shortly after the seizure  C.  The patient has new problems such as difficulty seeing, speaking or moving  D.  The patient was injured during the seizure  E.  The patient has a temperature over 102 F (39C)  F.  The patient vomited and now is having trouble breathing

## 2024-03-31 ENCOUNTER — Other Ambulatory Visit (HOSPITAL_COMMUNITY): Payer: Self-pay

## 2024-04-01 ENCOUNTER — Other Ambulatory Visit (HOSPITAL_COMMUNITY): Payer: Self-pay

## 2024-04-01 NOTE — Addendum Note (Signed)
 Addended by: Erica Hau on: 04/01/2024 04:15 PM   Modules accepted: Orders

## 2024-04-02 ENCOUNTER — Other Ambulatory Visit (HOSPITAL_COMMUNITY): Payer: Self-pay

## 2024-04-03 ENCOUNTER — Other Ambulatory Visit (HOSPITAL_COMMUNITY): Payer: Self-pay

## 2024-04-06 ENCOUNTER — Other Ambulatory Visit: Payer: Self-pay

## 2024-04-06 ENCOUNTER — Other Ambulatory Visit (HOSPITAL_COMMUNITY): Payer: Self-pay

## 2024-04-07 ENCOUNTER — Other Ambulatory Visit (HOSPITAL_COMMUNITY): Payer: Self-pay

## 2024-04-07 ENCOUNTER — Other Ambulatory Visit: Payer: Self-pay

## 2024-04-07 ENCOUNTER — Telehealth: Payer: Self-pay | Admitting: Neurology

## 2024-04-07 NOTE — Telephone Encounter (Signed)
Pt called no answer unable to leave a voice mail  

## 2024-04-07 NOTE — Telephone Encounter (Signed)
 Patient called and states that he needs to speak to someone about Clobazam . He states that the medication is causing drowsiness. He states that drinking coffee is not helping with that and he does not want to have to drink Red Bulls

## 2024-04-07 NOTE — Telephone Encounter (Signed)
 Pls have him cut it in half, so he takes 1 and 1/2 tablets every night (15mg ) which hopefully helps with the drowsiness. I hope as his body gets more used to the medication, we can increase back to 2 tablets every night, but for now do 1.5 tabs at bedtime. Thanks

## 2024-04-08 NOTE — Telephone Encounter (Signed)
Pt called no answer unable to leave a voice mail  

## 2024-04-10 NOTE — Telephone Encounter (Signed)
 Pt called informed that Dr Georjean wants him to cut it in half, so he takes 1 and 1/2 tablets every night (15mg ) which hopefully helps with the drowsiness. I hope as his body gets more used to the medication, we can increase back to 2 tablets every night, but for now do 1.5 tabs at bedtime

## 2024-04-14 ENCOUNTER — Other Ambulatory Visit (HOSPITAL_COMMUNITY): Payer: Self-pay

## 2024-04-17 ENCOUNTER — Ambulatory Visit (HOSPITAL_COMMUNITY)
Admission: EM | Admit: 2024-04-17 | Discharge: 2024-04-18 | Disposition: A | Discharge status: Other Acute Inpatient Hospital

## 2024-04-17 DIAGNOSIS — Z79899 Other long term (current) drug therapy: Secondary | ICD-10-CM | POA: Diagnosis not present

## 2024-04-17 DIAGNOSIS — Z87820 Personal history of traumatic brain injury: Secondary | ICD-10-CM | POA: Insufficient documentation

## 2024-04-17 DIAGNOSIS — F329 Major depressive disorder, single episode, unspecified: Secondary | ICD-10-CM | POA: Diagnosis not present

## 2024-04-17 DIAGNOSIS — F1290 Cannabis use, unspecified, uncomplicated: Secondary | ICD-10-CM

## 2024-04-17 DIAGNOSIS — R569 Unspecified convulsions: Secondary | ICD-10-CM | POA: Insufficient documentation

## 2024-04-17 LAB — COMPREHENSIVE METABOLIC PANEL WITH GFR
ALT: 17 U/L (ref 0–44)
AST: 22 U/L (ref 15–41)
Albumin: 3.7 g/dL (ref 3.5–5.0)
Alkaline Phosphatase: 50 U/L (ref 38–126)
Anion gap: 8 (ref 5–15)
BUN: 10 mg/dL (ref 6–20)
CO2: 25 mmol/L (ref 22–32)
Calcium: 9 mg/dL (ref 8.9–10.3)
Chloride: 103 mmol/L (ref 98–111)
Creatinine, Ser: 0.95 mg/dL (ref 0.61–1.24)
GFR, Estimated: >60 mL/min (ref >60)
Glucose, Bld: 95 mg/dL (ref 70–99)
Potassium: 4.1 mmol/L (ref 3.5–5.1)
Sodium: 136 mmol/L (ref 135–145)
Total Bilirubin: 0.6 mg/dL (ref 0.0–1.2)
Total Protein: 6.5 g/dL (ref 6.5–8.1)

## 2024-04-17 LAB — CBC
HCT: 43.2 % (ref 39.0–52.0)
Hemoglobin: 14.7 g/dL (ref 13.0–17.0)
MCH: 28.4 pg (ref 26.0–34.0)
MCHC: 34 g/dL (ref 30.0–36.0)
MCV: 83.4 fL (ref 80.0–100.0)
Platelets: 216 K/uL (ref 150–400)
RBC: 5.18 MIL/uL (ref 4.22–5.81)
RDW: 14.4 % (ref 11.5–15.5)
WBC: 4.7 K/uL (ref 4.0–10.5)
nRBC: 0 % (ref 0.0–0.2)

## 2024-04-17 LAB — TSH: TSH: 1.089 u[IU]/mL (ref 0.350–4.500)

## 2024-04-17 LAB — ETHANOL: Alcohol, Ethyl (B): <15 mg/dL (ref <15)

## 2024-04-17 MED ORDER — LEVETIRACETAM 500 MG PO TABS
1500.0000 mg | ORAL_TABLET | Freq: Every evening | ORAL | Status: DC
  Administered 2024-04-17: 1500 mg via ORAL

## 2024-04-17 MED ORDER — LEVETIRACETAM 500 MG PO TABS: 1500.0000 mg | ORAL_TABLET | Freq: Every morning | ORAL | Status: DC

## 2024-04-17 MED ORDER — CLOBAZAM 10 MG PO TABS: 20.0000 mg | ORAL_TABLET | Freq: Every day | ORAL | Status: DC

## 2024-04-17 MED ORDER — LEVETIRACETAM 500 MG PO TABS
1500.0000 mg | ORAL_TABLET | Freq: Every evening | ORAL | Status: DC
  Filled 2024-04-17: qty 3

## 2024-04-17 MED ORDER — LEVETIRACETAM 500 MG PO TABS: 1000.0000 mg | ORAL_TABLET | Freq: Every morning | ORAL | Status: DC

## 2024-04-17 MED ORDER — ESCITALOPRAM OXALATE 10 MG PO TABS
10.0000 mg | ORAL_TABLET | Freq: Every day | ORAL | Status: DC
  Administered 2024-04-17: 10 mg via ORAL
  Filled 2024-04-17: qty 1

## 2024-04-17 MED ORDER — DIAZEPAM 2.5 MG RE GEL: 10.0000 mg | RECTAL | Status: DC | PRN

## 2024-04-17 MED ORDER — ESLICARBAZEPINE ACETATE 600 MG PO TABS: 2.0000 | ORAL_TABLET | Freq: Every day | ORAL | Status: DC

## 2024-04-17 MED ORDER — FLUTICASONE PROPIONATE 50 MCG/ACT NA SUSP
1.0000 | Freq: Every day | NASAL | Status: DC
  Filled 2024-04-17: qty 16

## 2024-04-17 MED ORDER — DIAZEPAM 5 MG/ML IJ SOLN: 10.0000 mg | Freq: Three times a day (TID) | INTRAMUSCULAR | Status: DC | PRN

## 2024-04-17 MED ORDER — LEVETIRACETAM 500 MG PO TABS: 1000.0000 mg | ORAL_TABLET | Freq: Every evening | ORAL | Status: DC

## 2024-04-17 NOTE — Progress Notes (Signed)
 Pt has been accepted to [facility] ON 04/17/2024 . Bed assignment: 305-1   Pt meets inpatient criteria per Morene Cleveland, MD,  Attending Physician will be Dr. Prentis   Report can be called to: Adult unit: [phone]  Pt can arrive pending labs and completion of IVC   Care Team Notified: [facility] Bretta Qua, RN, Morene Cleveland, MD, Rafe Gerold, RN, Beulah Guan, LPN
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
## 2024-04-17 NOTE — Discharge Instructions (Addendum)

## 2024-04-17 NOTE — ED Notes (Signed)
 Patient alert and oriented.  Denies SI, HI, AVH, and pain. Scheduled medications offered to patient, per MD orders. Pt refused all medications. Support and encouragement provided.  Routine safety checks conducted every hour.  Patient informed to notify staff with problems or concerns. . Patient contracts for safety at this time. Pt is on the phone animated with elevated voice.  Pt reports  I want to go home. I have to work tomorrow. Im going to be missing my money and no one at [facility] is going to pay may car note.  Pt presents agitated but answered questions.  Offered PRN medication pt refused at this time.  Pt is pending IVC waiting on service from [employer].

## 2024-04-17 NOTE — ED Notes (Signed)
 Patient brought in by [employer] after he became suicidal and wanting to jump into traffic to kill himself.  Patient apparently is involved with a woman who he has 4 children by and she found out he cheated on her and she became upset with him and this triggered this episode.  Patient is IVC'd and will be going to [facility] once labs result and he is served.  Patient smells strongly of marijuana.  He is calm and composed.  Polite and cooperative with admission.  Patient appears subdued with sad affect and depressed mood.  He denies AVH shi now.  Contracts for safety.  Patient also has multiple warrants for his arrest and police will be serving them once he is discharged from the hospital.

## 2024-04-17 NOTE — ED Notes (Signed)
 Pt needs UDS and is aware. Cup at bedside

## 2024-04-17 NOTE — ED Notes (Signed)
 [employer] have just served IVC paper. Pt in bed resting with eyes closed no distress noted or voiced.

## 2024-04-18 ENCOUNTER — Other Ambulatory Visit (HOSPITAL_COMMUNITY): Payer: Self-pay

## 2024-04-18 ENCOUNTER — Other Ambulatory Visit: Payer: Self-pay

## 2024-04-18 ENCOUNTER — Encounter (HOSPITAL_COMMUNITY): Payer: Self-pay

## 2024-04-18 ENCOUNTER — Inpatient Hospital Stay (HOSPITAL_COMMUNITY)
Admission: AD | Admit: 2024-04-18 | Discharge: 2024-04-18 | DRG: 882 | Disposition: A | Discharge status: Home / Self Care | Source: Intra-hospital | Attending: Psychiatry | Admitting: Psychiatry

## 2024-04-18 DIAGNOSIS — R45851 Suicidal ideations: Secondary | ICD-10-CM | POA: Diagnosis present

## 2024-04-18 DIAGNOSIS — F1210 Cannabis abuse, uncomplicated: Secondary | ICD-10-CM | POA: Diagnosis present

## 2024-04-18 DIAGNOSIS — J45909 Unspecified asthma, uncomplicated: Secondary | ICD-10-CM | POA: Diagnosis present

## 2024-04-18 DIAGNOSIS — F17210 Nicotine dependence, cigarettes, uncomplicated: Secondary | ICD-10-CM | POA: Diagnosis present

## 2024-04-18 DIAGNOSIS — G40109 Localization-related (focal) (partial) symptomatic epilepsy and epileptic syndromes with simple partial seizures, not intractable, without status epilepticus: Secondary | ICD-10-CM | POA: Diagnosis present

## 2024-04-18 DIAGNOSIS — F4323 Adjustment disorder with mixed anxiety and depressed mood: Secondary | ICD-10-CM | POA: Diagnosis present

## 2024-04-18 DIAGNOSIS — R4584 Anhedonia: Secondary | ICD-10-CM | POA: Diagnosis present

## 2024-04-18 DIAGNOSIS — Z87820 Personal history of traumatic brain injury: Secondary | ICD-10-CM

## 2024-04-18 DIAGNOSIS — Z87898 Personal history of other specified conditions: Secondary | ICD-10-CM | POA: Diagnosis not present

## 2024-04-18 LAB — POCT URINE DRUG SCREEN - MANUAL ENTRY (I-SCREEN)
POC Amphetamine UR: NOT DETECTED
POC Buprenorphine (BUP): NOT DETECTED
POC Cocaine UR: NOT DETECTED
POC Marijuana UR: POSITIVE — AB
POC Methadone UR: NOT DETECTED
POC Methamphetamine UR: NOT DETECTED
POC Morphine: NOT DETECTED
POC Oxazepam (BZO): POSITIVE — AB
POC Oxycodone UR: NOT DETECTED
POC Secobarbital (BAR): NOT DETECTED

## 2024-04-18 MED ORDER — ACETAMINOPHEN 325 MG PO TABS: 650.0000 mg | ORAL_TABLET | Freq: Four times a day (QID) | ORAL | Status: DC | PRN

## 2024-04-18 MED ORDER — ESLICARBAZEPINE ACETATE 600 MG PO TABS: 1200.0000 mg | ORAL_TABLET | Freq: Every day | ORAL | Status: DC

## 2024-04-18 MED ORDER — LEVETIRACETAM 500 MG PO TABS
1500.0000 mg | ORAL_TABLET | Freq: Every morning | ORAL | Status: DC
  Administered 2024-04-18: 1500 mg via ORAL
  Filled 2024-04-18: qty 3

## 2024-04-18 MED ORDER — ESLICARBAZEPINE ACETATE 600 MG PO TABS
2.0000 | ORAL_TABLET | Freq: Every day | ORAL | Status: DC
  Filled 2024-04-18: qty 2

## 2024-04-18 MED ORDER — ESCITALOPRAM OXALATE 10 MG PO TABS
10.0000 mg | ORAL_TABLET | Freq: Every day | ORAL | Status: DC
  Administered 2024-04-18: 10 mg via ORAL
  Filled 2024-04-18: qty 1

## 2024-04-18 MED ORDER — DIAZEPAM (20 MG DOSE) 2 X 10 MG/0.1ML NA LQPK: 20.0000 mg | NASAL | Status: DC | PRN

## 2024-04-18 MED ORDER — NICOTINE POLACRILEX 2 MG MT GUM: 2.0000 mg | CHEWING_GUM | OROMUCOSAL | Status: DC | PRN

## 2024-04-18 MED ORDER — ESCITALOPRAM OXALATE 10 MG PO TABS
10.0000 mg | ORAL_TABLET | Freq: Every day | ORAL | 0 refills | Status: AC
  Filled 2024-04-18 – 2024-04-30 (×2): qty 30, 30d supply, fill #0

## 2024-04-18 MED ORDER — LEVETIRACETAM 500 MG PO TABS: 1500.0000 mg | ORAL_TABLET | Freq: Every evening | ORAL | Status: DC

## 2024-04-18 MED ORDER — MAGNESIUM HYDROXIDE 400 MG/5ML PO SUSP: 30.0000 mL | Freq: Every day | ORAL | Status: DC | PRN

## 2024-04-18 MED ORDER — FLUTICASONE PROPIONATE 50 MCG/ACT NA SUSP: 2.0000 | Freq: Every day | NASAL | Status: DC

## 2024-04-18 MED ORDER — ALUM & MAG HYDROXIDE-SIMETH 200-200-20 MG/5ML PO SUSP: 30.0000 mL | ORAL | Status: DC | PRN

## 2024-04-18 MED ORDER — CLOBAZAM 5 MG PO HALF TABLET: 20.0000 mg | ORAL_TABLET | Freq: Every day | ORAL | Status: DC

## 2024-04-18 MED ORDER — GABAPENTIN 100 MG PO CAPS
100.0000 mg | ORAL_CAPSULE | Freq: Three times a day (TID) | ORAL | 0 refills | Status: AC | PRN
  Filled 2024-04-18 – 2024-04-30 (×2): qty 90, 30d supply, fill #0

## 2024-04-18 NOTE — Progress Notes (Signed)
  [facility] Adult Case Management Discharge Plan :  Will you be returning to the same living situation after discharge:  Yes,    At discharge, do you have transportation home?: Yes,  mother Do you have the ability to pay for your medications: Yes,     Release of information consent forms completed and in the chart;  Patient's signature needed at discharge.  Patient to Follow up at:  Follow-up Information     [facility] Call.   Why: [address], [place], [place] [address] Areas served: [place] Hours:  Saturday 8 AM-12 PM Sunday Closed Monday 8 AM-5:30 PM Tuesday 8 AM-5:30 PM Wednesday 8 AM-5:30 PM Thursday 8 AM-5:30 PM Friday 8 AM-5:30 PM  Phone: [phone] Contact information: [address] [place] [address] [phone]         [facility] Follow up.   Specialty: Urgent Care Why: walk in if needed to be seen before making a appointment. Contact information: [address] [place]  [address] [phone]                Next level of care provider has access to [facility] Link:no  Safety Planning and Suicide Prevention discussed: Yes,  [name] [facility] [phone]     Has patient been referred to the Quitline?: Patient refused referral for treatment  Patient has been referred for addiction treatment: Patient refused referral for treatment.  [name], LCSWA 04/18/2024, 11:53 AM
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
## 2024-04-18 NOTE — BH IP Treatment Plan (Signed)
 Interdisciplinary Treatment and Diagnostic Plan Update  04/18/2024 Time of Session: 0930 Kyle Adams MRN: [id number]  Principal Diagnosis: Suicidal ideation  Secondary Diagnoses: Principal Problem:   Suicidal ideation   Current Medications:  Current Facility-Administered Medications  Medication Dose Route Frequency Provider Last Rate Last Admin   acetaminophen  (TYLENOL ) tablet 650 mg  650 mg Oral Q6H PRN Rollene Katz, MD       alum & mag hydroxide-simeth (MAALOX/MYLANTA) 200-200-20 MG/5ML suspension 30 mL  30 mL Oral Q4H PRN Rollene Katz, MD       cloBAZam  (ONFI ) tablet 20 mg  20 mg Oral QHS Delsie Lynwood Katz Lavone, MD       diazePAM  (20 MG Dose) LQPK 20 mg  20 mg Nasal Q2H PRN Delsie Lynwood Katz Lavone, MD       escitalopram  (LEXAPRO ) tablet 10 mg  10 mg Oral Daily Rollene Katz, MD   10 mg at 04/18/24 9061   Eslicarbazepine Acetate  TABS 2 tablet  2 tablet Oral QHS Delsie Lynwood Katz Lavone, MD       fluticasone  (FLONASE ) 50 MCG/ACT nasal spray 2 spray  2 spray Each Nare Daily Delsie Lynwood Katz Lavone, MD       levETIRAcetam  (KEPPRA ) tablet 1,500 mg  1,500 mg Oral q morning Delsie Lynwood Katz Lavone, MD   1,500 mg at 04/18/24 9060   And   levETIRAcetam  (KEPPRA ) tablet 1,500 mg  1,500 mg Oral QPM Delsie Lynwood Katz Lavone, MD       magnesium  hydroxide (MILK OF MAGNESIA) suspension 30 mL  30 mL Oral Daily PRN Rollene Katz, MD       nicotine  polacrilex (NICORETTE ) gum 2 mg  2 mg Oral PRN Bobbitt, Shalon E, NP       PTA Medications: Medications Prior to Admission  Medication Sig Dispense Refill Last Dose/Taking   cloBAZam  (ONFI ) 10 MG tablet Take 2 tablets by mouth nightly. 180 tablet 3    diazePAM , 20 MG Dose, (VALTOCO  20 MG DOSE) 2 x 10 MG/0.1ML LQPK Administer one spray in one nostril, second spray in other nostril (one dose) as needed for seizure. May use second dose after 4 hours if needed 10 each 5    Eslicarbazepine  Acetate 600 MG TABS Take 2 tablets by mouth nightly. 180 tablet 3    fluticasone  (FLONASE ) 50 MCG/ACT nasal spray Place into both nostrils.      levETIRAcetam  (KEPPRA ) 750 MG tablet Take 2 tablets (1,500 mg total) by mouth every morning AND 2 tablets (1,500 mg total) every evening. 360 tablet 3     Patient Stressors:  Interpersonal stressors, psychosocial stressors, single parent  Patient Strengths:  Insight, resilience, engagement in treatment  Treatment Modalities: Medication Management, Group therapy, Case management,  1 to 1 session with clinician, Psychoeducation, Recreational therapy.   Physician Treatment Plan for Primary Diagnosis: Suicidal ideation Long Term Goal(s):   Establish treatment with a therapist  Short Term Goals:  Resolution of suicidal ideation  Medication Management: Evaluate patient's response, side effects, and tolerance of medication regimen.  Therapeutic Interventions: 1 to 1 sessions, Unit Group sessions and Medication administration.  Evaluation of Outcomes: Adequate for Discharge   RN Treatment Plan for Primary Diagnosis: Suicidal ideation Long Term Goal(s): Knowledge of disease and therapeutic regimen to maintain health will improve  Short Term Goals: Ability to verbalize frustration and anger appropriately will improve, Ability to demonstrate self-control, Ability to participate in decision making will improve, Ability to verbalize feelings will improve, Ability to disclose and  discuss suicidal ideas, and Ability to identify and develop effective coping behaviors will improve  Medication Management: RN will administer medications as ordered by provider, will assess and evaluate patient's response and provide education to patient for prescribed medication. RN will report any adverse and/or side effects to prescribing provider.  Therapeutic Interventions: 1 on 1 counseling sessions, Psychoeducation, Medication administration, Evaluate responses to  treatment, Monitor vital signs and CBGs as ordered, Perform/monitor CIWA, COWS, AIMS and Fall Risk screenings as ordered, Perform wound care treatments as ordered.  Evaluation of Outcomes: Adequate for Discharge   LCSW Treatment Plan for Primary Diagnosis: Suicidal ideation Long Term Goal(s): Safe transition to appropriate next level of care at discharge, Engage patient in therapeutic group addressing interpersonal concerns.  Short Term Goals: Engage patient in aftercare planning with referrals and resources, Increase social support, Increase ability to appropriately verbalize feelings, Increase emotional regulation, Facilitate acceptance of mental health diagnosis and concerns, Facilitate patient progression through stages of change regarding substance use diagnoses and concerns, Identify triggers associated with mental health/substance abuse issues, and Increase skills for wellness and recovery  Therapeutic Interventions: Assess for all discharge needs, 1 to 1 time with Social worker, Explore available resources and support systems, Assess for adequacy in community support network, Educate family and significant other(s) on suicide prevention, Complete Psychosocial Assessment, Interpersonal group therapy.  Evaluation of Outcomes: Adequate for Discharge   Progress in Treatment: Attending groups: Yes. Participating in groups: Yes. Taking medication as prescribed: Yes. Toleration medication: Yes. Patient understands diagnosis: Yes. Discussing patient identified problems/goals with staff: Yes. Medical problems stabilized or resolved: Yes. Denies suicidal/homicidal ideation: Yes. Issues/concerns per patient self-inventory: Yes.  Discharge Plan or Barriers: Referral for outpatient therapy, follow-up with neurology and PCP  Reason for Continuation of Hospitalization: None, patient will be discharged  Estimated Length of Stay: 1 day  Last 3 [place] Suicide Severity Risk Score: Flowsheet  Row [facility]Current) [facility] from [facility]17/2024 in [facility] ED from 04/30/2023 in [facility] Emergency Department at [facility]  C-SSRS RISK CATEGORY High Risk High Risk No Risk    Last [facility] 2/9 Scores:     No data to display          Scribe for Treatment Team: Oliva DELENA Salmon, DO 04/18/2024 11:10 AM
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
## 2024-04-18 NOTE — Progress Notes (Signed)
Patient is discharging at this time. Patient is A&Ox4. Stable. Patient denies SI,HI, and A/V/H with no plan/intent. Printed AVS reviewed with and given to patient along with medications and follow up appointments. Suicide safety plan complete with copy provided to patient. Original form in chart. Patient verbalized all understanding. All valuables/belongings returned to patient. Patient is being transported by his mother . Patient denies any pain/discomfort. No s/s of current distress.

## 2024-04-18 NOTE — Group Note (Signed)
 Date:  04/18/2024 Time:  9:30 AM  Group Topic/Focus:  Goals Group:   The focus of this group is to help patients establish daily goals to achieve during treatment and discuss how the patient can incorporate goal setting into their daily lives to aide in recovery.    Participation Level:  Did Not Attend  Participation Quality:    Affect:    Cognitive:    Insight:   Engagement in Group:    Modes of Intervention:    Additional Comments:  Pt were encourage to attend group. Pt refused to attend.  Kyle Adams Dawn 04/18/2024, 9:30 AM
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
## 2024-04-18 NOTE — ED Notes (Signed)
 Pt reports frustration with process of being in facility.  He was notified that he has been placed under IVC. Pt became angry with that stating he needs to leave for work in the morning or he would be fired.  Pt states he spoke with the provider on day shift who gave him the option of leaving but later encouraged him to stay overnight.  Pt was not informed he was going to be IVCd by provider.  Pt will follow instructions but states he cannot loose his job due to being in the hospital.  Clinical research associate spoke with pt and told him the plan for him to go to [facility] and that it would be under IVC.
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
## 2024-04-18 NOTE — Discharge Instructions (Signed)
 Closing notes from your doctor: It was a pleasure taking care of you while you were with us . I want to stress once more that medications are part of, but not ALL of what we must do to take care of ourselves mentally.   We all need: - Physical activity - at least 3, but preferably 5 days per week where we exercise for 30 minutes at a level where we get out of breath. That will vary by person and health conditions, but it will improve your mental health as well as your physical health. - Good restful sleep - avoid using a phone or screens in bed, sleep in a dark room, use a white noise machine or app and if you are having trouble sleeping, consult your doctor. - To reduce use of substances - alcohol and tobacco are shown to be harmful to many parts of our health, if you need help, ask for it. The same is true for illegal drugs, if you need help, ask for it. - To find purpose - whether it is caring for loved ones, volunteering, pursuing hobbies, or doing work that means something to you, try and live in accordance with your values. - Healthy relationships - seek healthy relationships where the other person helps you grow and challenges you to be your best. If it does not feel good, seek assistance.  JINNY Morene GORMAN Delsie, MD [facility] Psychiatry Resident ____ Learning about therapy: The term therapy can mean many things. There are many different types of psychotherapy, some are short-term, others take a longer period of time. A good therapist will meet with you, discuss your goals, and help you set a treatment plan that addresses your major concerns, regardless of what type of therapy they practice. If the first therapist is not a great fit, try another one.   The American Psychological Association (the APA) has resources to learn about different kinds of therapy.  Here is a link to learn more about the different kinds of psychotherapy:    [email or web address]  _ Adjustment Disorder Discharge Guide What Is Adjustment Disorder? Adjustment disorder is a common mental health condition that happens when someone has trouble coping with a stressful life event, such as a loss, relationship change, or major life transition. It can cause feelings of sadness, worry, or trouble functioning in daily life. These symptoms are usually temporary but can be distressing.[1] Why Am I Being Prescribed Escitalopram  and Gabapentin ? - Escitalopram  is a medication often used to treat depression and anxiety. It belongs to a group of medicines called selective serotonin reuptake inhibitors (SSRIs). Escitalopram  can help improve mood and reduce anxiety, but it may take several weeks to feel the full effect.[2][3][4][5] - Gabapentin  is a medicine that can help with anxiety and certain types of nerve pain. It is sometimes used when anxiety is a major problem, especially if other medicines are not suitable. Gabapentin  is not addictive and may be helpful for people who cannot take other anxiety medicines.[6][7][8] How Should I Take My Medications? - Escitalopram : Take this medicine once a day, at the same time each day, with or without food. Do not stop taking it suddenly without talking to your doctor, as this can cause withdrawal symptoms.[4][5] - Gabapentin : Take this medicine three times a day, as prescribed. Try to take it at the same times each day. Do not stop taking it suddenly without medical advice. What Side Effects Should I Watch For? - Escitalopram : Some people may feel more anxious, restless, or  have trouble sleeping when starting this medicine. Rarely, it can cause thoughts of self-harm, especially in the first few weeks or when the dose changes. Tell someone you trust and contact your doctor or go to the emergency room if you have thoughts of harming yourself.[4][5] - Gabapentin : This medicine can cause drowsiness,  dizziness, or swelling in the legs. Avoid driving or using heavy machinery until you know how it affects you. Let your doctor know if you have any unusual symptoms.[6][7][8] Why Is Therapy Important? While medicines can help with symptoms, talking therapies (like cognitive behavioral therapy, or CBT) are the main treatment for adjustment disorder. Therapy can help you learn new ways to cope with stress, manage your feelings, and recover more fully. Studies show that people with adjustment disorder often do best when they combine therapy with medication, especially if symptoms are severe.[2][9][10][11][12][13][14] How Do I Find a Therapist? - Ask your doctor for a referral. - Contact your insurance company for a list of covered therapists. - Look for licensed mental health professionals in your area. What Else Can I Do to Feel Better? - Try to keep a regular routine, including sleep, meals,   References Adjustment Disorder: Current Perspectives. Zelviene P, Kazlauskas E. Neuropsychiatric Disease and Treatment. 2018;14:375-381. doi:10.2147/NDT.D878927. Anxiety Screening: Evidence Report and Systematic Review for the [place]  Preventive Services Task Force. O'Connor EA, Henninger ML, Perdue LA, et al. JAMA. 2023;329(24):2171-2184. doi:10.1001/jama.7976.3630. Anxiety Disorders: A Review. Scarlet BILLI Naegeli NM. JAMA. 2022;328(24):2431-2445. doi:10.1001/jama.7977.77255. Lexapro . Food and Drug Administration. Updated date: 2022-07-15. Lexapro . Food and Drug Administration. Updated date: 2012-10-22. Gabapentin  and Pregabalin in Bipolar Disorder, Anxiety States, and Insomnia: Systematic Review, Meta-Analysis, and Rationale. Hong JSW, Valma LZ, Mariano SAILOR, et al. Molecular Psychiatry. 2022;27(3):1339-1349. doi:10.1038/s41380-021-01386-6. A Randomized, Controlled, Double-Blinded Clinical Trial of Gabapentin  300 Versus 900 Mg Versus Placebo for Anxiety Symptoms in Breast Cancer Survivors. Lavigne JE, Veneda JAYSON Will ORLINDA, et al. Breast Cancer Research and Treatment. 2012;136(2):479-86. doi:10.1007/s10549-(339) 854-0799-x. Gabapentin  Reduces Preoperative Anxiety and Pain Catastrophizing in Highly Anxious Patients Prior to Major Surgery: A Blinded Randomized Placebo-Controlled Trial. Orval DEL, Erminia SLEEPER, Orser BA, et al. Tomasa Journal of Anaesthesia = Journal Canadien d'Anesthesie. 2013;60(5):432-43. doi:10.1007/s12630-737-655-0175-1. Therapeutic Interventions for Adjustment Disorder: A Systematic Review. Constantin D, Dinu EA, Rogozea L, Burtea V, Leasu FG. American Journal of Therapeutics. 2020 Jul/Aug;27(4):e375-e386. doi:10.1097/MJT.0000000000001170. A Systematic Review of Psychological and Pharmacological Treatments for Adjustment Disorder in Adults. Dario NASUTI, Parley MALVA Mathew LITTIE Anitra DELENA Adora T. Journal of Traumatic Stress. 2018;31(3):321-331. doi:10.1002/jts.22295. Psychotherapy of Adjustment Disorders: Current State and Future Directions. Domhardt CHRISTELLA Car H. The World Journal of Biological Psychiatry : The Official Journal of the [employer]. 2018;19(sup1):S21-S35. doi:10.1080/15622975.2018.1467041. Ready When You Are: Answering Your Questions About Psychotherapy. Mimi KEELS, Farber BA. Journal of Clinical Psychology. 2020;76(8):1438-1446. doi:10.1002/jclp.77003. Optimizing Treatment Expectations and Decision Making Through Informed Consent for Psychotherapy: A Randomized Controlled Trial. Elissa LITTIE, Clydene FALCON, Ladwig S, et al. Journal of Consulting and Clinical Psychology. 2024;92(2):93-104. doi:10.1037/ccp0000851. Cross-Sectional Examination of Therapy Knowledge and Preferred Sources Among American Adults Not Receiving Mental Healthcare. Evern SCOT, Geneva AE, Anestis MD, Wenceslao SPEAK. Patient Education and Counseling. 7974;861:890812. doi:10.1016/j.pec.2025.109187.

## 2024-04-18 NOTE — Plan of Care (Signed)

## 2024-04-18 NOTE — [facility] Suicide Risk Assessment (Signed)
 [facility] INPATIENT:  Family/Significant Other Suicide Prevention Education  Suicide Prevention Education:  Education Completed; [name] Cable [phone],  (name of family member/significant other) has been identified by the patient as the family member/significant other with whom the patient will be residing, and identified as the person(s) who will aid the patient in the event of a mental health crisis (suicidal ideations/suicide attempt).  With written consent from the patient, the family member/significant other has been provided the following suicide prevention education, prior to the and/or following the discharge of the patient.  The suicide prevention education provided includes the following: Suicide risk factors Suicide prevention and interventions National Suicide Hotline telephone number [facility] assessment telephone number [facility] Emergency Assistance 911 [facility] and/or Residential Mobile Crisis Unit telephone number  Request made of family/significant other to: Remove weapons (e.g., guns, rifles, knives), all items previously/currently identified as safety concern.   Remove drugs/medications (over-the-counter, prescriptions, illicit drugs), all items previously/currently identified as a safety concern.  The family member/significant other verbalizes understanding of the suicide prevention education information provided.  The family member/significant other agrees to remove the items of safety concern listed above.  Lianette Broussard LCSWA 04/18/2024, 11:52 AM

## 2024-04-21 ENCOUNTER — Other Ambulatory Visit: Payer: Self-pay

## 2024-04-21 ENCOUNTER — Telehealth: Payer: Self-pay

## 2024-04-21 NOTE — Telephone Encounter (Signed)
 Pt needs a PA for eslicarbazepine acetate  per pharmacy

## 2024-04-22 ENCOUNTER — Other Ambulatory Visit (HOSPITAL_COMMUNITY): Payer: Self-pay

## 2024-04-22 NOTE — Telephone Encounter (Addendum)
 PA is not needed. Patient has been receiving BRAND since that is insurance preferred.

## 2024-04-23 ENCOUNTER — Other Ambulatory Visit: Payer: Self-pay

## 2024-04-23 ENCOUNTER — Other Ambulatory Visit (HOSPITAL_COMMUNITY): Payer: Self-pay

## 2024-04-24 ENCOUNTER — Other Ambulatory Visit: Payer: Self-pay

## 2024-04-24 ENCOUNTER — Encounter: Payer: Self-pay | Admitting: Pharmacist

## 2024-04-29 ENCOUNTER — Other Ambulatory Visit: Payer: Self-pay

## 2024-04-30 ENCOUNTER — Other Ambulatory Visit: Payer: Self-pay

## 2024-04-30 ENCOUNTER — Other Ambulatory Visit (HOSPITAL_COMMUNITY): Payer: Self-pay

## 2024-05-12 ENCOUNTER — Other Ambulatory Visit (HOSPITAL_COMMUNITY): Payer: Self-pay

## 2024-05-13 ENCOUNTER — Other Ambulatory Visit: Payer: Self-pay

## 2024-05-19 ENCOUNTER — Other Ambulatory Visit: Payer: Self-pay

## 2024-05-19 ENCOUNTER — Other Ambulatory Visit (HOSPITAL_COMMUNITY): Payer: Self-pay

## 2024-05-25 ENCOUNTER — Telehealth: Payer: Self-pay | Admitting: Neurology

## 2024-05-25 NOTE — Telephone Encounter (Signed)
 Kyle Adams called and LM with AN. Pt is incarcerated and is in need of medication

## 2024-05-25 NOTE — Telephone Encounter (Signed)
 Call want go thru at 05/25/2024 at 10:42am

## 2024-05-26 NOTE — Telephone Encounter (Signed)
 Call can't go thru, tried multiple times.

## 2024-05-26 NOTE — Telephone Encounter (Signed)
 Will send mychart message.

## 2024-06-02 ENCOUNTER — Other Ambulatory Visit (HOSPITAL_COMMUNITY): Payer: Self-pay

## 2024-06-16 ENCOUNTER — Other Ambulatory Visit (HOSPITAL_COMMUNITY): Payer: Self-pay

## 2024-06-16 ENCOUNTER — Other Ambulatory Visit: Payer: Self-pay

## 2024-06-17 ENCOUNTER — Other Ambulatory Visit (HOSPITAL_COMMUNITY): Payer: Self-pay

## 2024-07-03 ENCOUNTER — Encounter (HOSPITAL_BASED_OUTPATIENT_CLINIC_OR_DEPARTMENT_OTHER): Payer: Self-pay

## 2024-07-03 ENCOUNTER — Other Ambulatory Visit: Payer: Self-pay

## 2024-07-03 ENCOUNTER — Other Ambulatory Visit (HOSPITAL_COMMUNITY): Payer: Self-pay

## 2024-07-03 ENCOUNTER — Other Ambulatory Visit (HOSPITAL_BASED_OUTPATIENT_CLINIC_OR_DEPARTMENT_OTHER): Payer: Self-pay

## 2024-07-04 ENCOUNTER — Other Ambulatory Visit (HOSPITAL_COMMUNITY): Payer: Self-pay

## 2024-07-14 ENCOUNTER — Encounter: Payer: Self-pay | Admitting: Neurology

## 2024-07-14 ENCOUNTER — Telehealth: Admitting: Neurology

## 2024-07-14 DIAGNOSIS — G40009 Localization-related (focal) (partial) idiopathic epilepsy and epileptic syndromes with seizures of localized onset, not intractable, without status epilepticus: Secondary | ICD-10-CM

## 2024-07-14 DIAGNOSIS — R569 Unspecified convulsions: Secondary | ICD-10-CM

## 2024-07-14 MED ORDER — CLOBAZAM 10 MG PO TABS
20.0000 mg | ORAL_TABLET | Freq: Every evening | ORAL | 3 refills | Status: DC
  Filled 2024-07-14 – 2024-07-17 (×3): qty 60, 30d supply, fill #0

## 2024-07-14 MED ORDER — LEVETIRACETAM 750 MG PO TABS
ORAL_TABLET | ORAL | 3 refills | Status: AC
  Filled 2024-07-14 – 2024-08-07 (×4): qty 360, 90d supply, fill #0
  Filled 2024-11-05 – 2024-11-18 (×2): qty 360, 90d supply, fill #1

## 2024-07-14 MED ORDER — ESLICARBAZEPINE ACETATE 600 MG PO TABS
2.0000 | ORAL_TABLET | Freq: Every evening | ORAL | 3 refills | Status: AC
  Filled 2024-07-14 – 2024-07-17 (×3): qty 180, 90d supply, fill #0
  Filled 2024-10-15: qty 180, 90d supply, fill #1
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
## 2024-07-15 ENCOUNTER — Encounter: Payer: Self-pay | Admitting: Pharmacist

## 2024-07-15 ENCOUNTER — Other Ambulatory Visit: Payer: Self-pay

## 2024-07-15 ENCOUNTER — Other Ambulatory Visit (HOSPITAL_COMMUNITY): Payer: Self-pay

## 2024-07-16 ENCOUNTER — Encounter: Payer: Self-pay | Admitting: Pharmacist

## 2024-07-16 ENCOUNTER — Other Ambulatory Visit: Payer: Self-pay

## 2024-07-16 ENCOUNTER — Telehealth: Payer: Self-pay | Admitting: Neurology

## 2024-07-16 ENCOUNTER — Other Ambulatory Visit (HOSPITAL_COMMUNITY): Payer: Self-pay

## 2024-07-16 NOTE — Telephone Encounter (Signed)
 Pt called in and left a message. Our office sent several prescriptions over for him to [facility] and they mailed them out to him, but he is out of his Aptium. He is not sure what to do? He doesn't want to miss a dose.

## 2024-07-16 NOTE — Telephone Encounter (Signed)
 Pls have him let [facility] know about this, they can usually provide an emergency supply. I am not sure we have samples.

## 2024-07-17 ENCOUNTER — Other Ambulatory Visit: Payer: Self-pay

## 2024-07-17 ENCOUNTER — Other Ambulatory Visit (HOSPITAL_COMMUNITY): Payer: Self-pay

## 2024-07-17 NOTE — Telephone Encounter (Signed)
 Unable to LVM-pt spouse number is not in service, tried 3x pt but no answer.

## 2024-07-17 NOTE — Telephone Encounter (Signed)
 LVM--to call the office back.

## 2024-07-18 ENCOUNTER — Other Ambulatory Visit (HOSPITAL_COMMUNITY): Payer: Self-pay

## 2024-07-27 ENCOUNTER — Telehealth: Payer: Self-pay | Admitting: Neurology

## 2024-07-27 NOTE — Telephone Encounter (Signed)
 Pt is needing to speak to someone about the medication clobazam  10 mg   He states that it makes him sleepy please call

## 2024-07-27 NOTE — Telephone Encounter (Signed)
 Can you pls ask if he can do either in-person visit tomorrow at 9:30am to discuss switching to a different medication? Due to government shutdown, we are not allowed to do virtual visits right now. Thanks

## 2024-07-27 NOTE — Telephone Encounter (Signed)
 Pt called no answer left a voice mail to call the office back

## 2024-07-29 NOTE — Telephone Encounter (Signed)
 Pls try patient again, I have openings tomorrow and Friday, thanks

## 2024-07-29 NOTE — Telephone Encounter (Signed)
 Pt called he his coming in tomorrow at 8:30 to see Dr Georjean

## 2024-07-30 ENCOUNTER — Encounter: Payer: Self-pay | Admitting: Neurology

## 2024-07-30 ENCOUNTER — Ambulatory Visit: Admitting: Neurology

## 2024-08-03 ENCOUNTER — Telehealth: Payer: Self-pay | Admitting: Neurology

## 2024-08-03 ENCOUNTER — Telehealth: Payer: Self-pay

## 2024-08-03 NOTE — Telephone Encounter (Signed)
 noted

## 2024-08-03 NOTE — Telephone Encounter (Signed)
 Pt c/o: seizure Missed medications?  No. Sleep deprived?  No. Alcohol intake?  No. Increased stress? Yes.   Any change in medication color or shape? No. Any trigger? He felt like he was going to vomit, Back to their usual baseline self?  Getting there  If no, advise go to ER Current medications prescribed by Dr. Georjean:   levETIRAcetam  (KEPPRA ) 750 MG tablet Take 2 tablets (1,500 mg total) by mouth every morning AND 2 tablets (1,500 mg total) every evening  Eslicarbazepine Acetate  600 MG TABS Take 2 tablets by mouth nightly  cloBAZam  (ONFI ) 10 MG tablet Take 2 tablets by mouth nightly.  diazePAM , 20 MG Dose, (VALTOCO  20 MG DOSE) 2 x 10 MG/0.1ML LQPK Administer one spray in one nostril, second spray in other nostril (one dose) as needed for seizure. May use second dose after 4 hours if needed   Pt spouse calling in that pt is having seizure. He is sweating he feels like he wants to vomit, he can't stand up, he had the seizure about 5 mis ago Pt is alert but laying in the floor he isnt able to get up not able to walk

## 2024-08-03 NOTE — Telephone Encounter (Signed)
 Spoke with Dr Georjean she advised me to tell pt spouse to use Valtoco  now to help prevent another seizure, If pt hasn't recovered in 15 min he needs to go to the ER for evaluation, and we have an appointment open for 10 am tomorrow he has been scheduled for. Pt spouse stated that she would get him here for.

## 2024-08-03 NOTE — Telephone Encounter (Signed)
 Another phone note has been started closing this note,

## 2024-08-03 NOTE — Telephone Encounter (Signed)
 Who's calling (name and relationship to patient) : [name] Sager)  Best contact number: [phone]  Provider they see: Dr.Aquino  Reason for call: [name] called in wanting to speak with the provider or the nurse   Call ID: Not feeling well, and has not fell like that before. He is in the process of having a seizure now, started about 5 mins ago.   FYI: Call routed to [facility].

## 2024-08-04 ENCOUNTER — Other Ambulatory Visit: Payer: Self-pay

## 2024-08-04 ENCOUNTER — Encounter: Payer: Self-pay | Admitting: Neurology

## 2024-08-04 ENCOUNTER — Ambulatory Visit (INDEPENDENT_AMBULATORY_CARE_PROVIDER_SITE_OTHER): Admitting: Neurology

## 2024-08-04 VITALS — BP 113/76 | HR 73 | Ht 69.0 in | Wt 167.0 lb

## 2024-08-04 DIAGNOSIS — G40009 Localization-related (focal) (partial) idiopathic epilepsy and epileptic syndromes with seizures of localized onset, not intractable, without status epilepticus: Secondary | ICD-10-CM

## 2024-08-04 MED ORDER — LAMOTRIGINE 25 MG PO TABS
ORAL_TABLET | ORAL | 6 refills | Status: DC
  Filled 2024-08-04: qty 120, 30d supply, fill #0
  Filled 2024-08-06: qty 120, 90d supply, fill #0
  Filled 2024-08-07: qty 120, 30d supply, fill #0
  Filled 2024-09-20 – 2024-10-19 (×2): qty 120, 30d supply, fill #1
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
## 2024-08-04 NOTE — Patient Instructions (Signed)
 Good to see you feeling better.  Let's start Lamotrigine 25mg : Take 1 tablet every night for 2 weeks, then increase to 1 tablet twice a day for 2 weeks, then increase to 2 tablets twice a day and continue  2. Continue the Clobazam  10mg : 2 tablets for another week as you start the new medication. After 1 week, reduce Clobazam  to 1 tablet every night for 1 week, then stop Clobazam   3. Continue Keppra  and Aptiom   4. Please set up appointment with a Primary Care Doctor to help manage the asthma better  5. Follow-up in 6-8 weeks, call for any changes

## 2024-08-05 ENCOUNTER — Other Ambulatory Visit (HOSPITAL_COMMUNITY): Payer: Self-pay

## 2024-08-05 ENCOUNTER — Other Ambulatory Visit: Payer: Self-pay

## 2024-08-06 ENCOUNTER — Other Ambulatory Visit (HOSPITAL_COMMUNITY): Payer: Self-pay

## 2024-08-07 ENCOUNTER — Other Ambulatory Visit (HOSPITAL_COMMUNITY): Payer: Self-pay

## 2024-08-07 ENCOUNTER — Other Ambulatory Visit: Payer: Self-pay

## 2024-08-08 ENCOUNTER — Other Ambulatory Visit (HOSPITAL_COMMUNITY): Payer: Self-pay

## 2024-08-09 ENCOUNTER — Other Ambulatory Visit (HOSPITAL_COMMUNITY): Payer: Self-pay

## 2024-08-10 ENCOUNTER — Other Ambulatory Visit: Payer: Self-pay

## 2024-08-20 ENCOUNTER — Encounter: Payer: Self-pay | Admitting: Neurology

## 2024-09-05 ENCOUNTER — Other Ambulatory Visit: Payer: Self-pay | Admitting: Neurology

## 2024-09-22 ENCOUNTER — Encounter: Payer: Self-pay | Admitting: Neurology

## 2024-09-22 ENCOUNTER — Ambulatory Visit: Admitting: Neurology

## 2024-10-09 ENCOUNTER — Encounter: Payer: Self-pay | Admitting: Neurology

## 2024-10-09 ENCOUNTER — Ambulatory Visit: Admitting: Neurology

## 2024-10-16 ENCOUNTER — Other Ambulatory Visit: Payer: Self-pay

## 2024-10-19 ENCOUNTER — Other Ambulatory Visit (HOSPITAL_COMMUNITY): Payer: Self-pay
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
## 2024-11-02 ENCOUNTER — Ambulatory Visit (INDEPENDENT_AMBULATORY_CARE_PROVIDER_SITE_OTHER): Payer: Self-pay | Admitting: Neurology

## 2024-11-02 ENCOUNTER — Encounter: Payer: Self-pay | Admitting: Neurology

## 2024-11-02 ENCOUNTER — Other Ambulatory Visit: Payer: Self-pay

## 2024-11-02 ENCOUNTER — Other Ambulatory Visit (HOSPITAL_COMMUNITY): Payer: Self-pay

## 2024-11-02 VITALS — BP 113/70 | HR 61 | Ht 70.0 in | Wt 168.6 lb

## 2024-11-02 DIAGNOSIS — G40009 Localization-related (focal) (partial) idiopathic epilepsy and epileptic syndromes with seizures of localized onset, not intractable, without status epilepticus: Secondary | ICD-10-CM | POA: Diagnosis not present

## 2024-11-02 MED ORDER — LAMOTRIGINE 100 MG PO TABS
ORAL_TABLET | ORAL | 11 refills | Status: AC
  Filled 2024-11-02: qty 60, 30d supply, fill #0

## 2024-11-02 MED ORDER — CLOBAZAM 10 MG PO TABS
20.0000 mg | ORAL_TABLET | Freq: Every evening | ORAL | 3 refills | Status: AC
  Filled 2024-11-02 – 2024-11-18 (×2): qty 60, 30d supply, fill #0

## 2024-11-02 NOTE — Progress Notes (Signed)
 Seizure in October
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
## 2024-11-06 ENCOUNTER — Other Ambulatory Visit (HOSPITAL_COMMUNITY): Payer: Self-pay

## 2024-11-07 ENCOUNTER — Other Ambulatory Visit (HOSPITAL_COMMUNITY): Payer: Self-pay

## 2024-11-16 ENCOUNTER — Ambulatory Visit: Admitting: Neurology

## 2024-11-17 ENCOUNTER — Other Ambulatory Visit (HOSPITAL_COMMUNITY): Payer: Self-pay

## 2024-11-18 ENCOUNTER — Other Ambulatory Visit (HOSPITAL_COMMUNITY): Payer: Self-pay

## 2024-12-16 ENCOUNTER — Ambulatory Visit (HOSPITAL_COMMUNITY)

## 2025-02-01 ENCOUNTER — Ambulatory Visit: Payer: Self-pay | Admitting: Neurology
